# Patient Record
Sex: Female | Born: 1935 | Race: White | Hispanic: No | Smoking: Former smoker
Health system: Southern US, Community
[De-identification: ages and names within clinical notes are randomized; demographics above are authoritative.]

## PROBLEM LIST (undated history)

## (undated) DIAGNOSIS — M1990 Unspecified osteoarthritis, unspecified site: Secondary | ICD-10-CM

## (undated) DIAGNOSIS — I10 Essential (primary) hypertension: Secondary | ICD-10-CM

## (undated) DIAGNOSIS — K859 Acute pancreatitis without necrosis or infection, unspecified: Secondary | ICD-10-CM

## (undated) DIAGNOSIS — I4891 Unspecified atrial fibrillation: Secondary | ICD-10-CM

## (undated) HISTORY — DX: Essential (primary) hypertension: I10

## (undated) HISTORY — PX: CHOLECYSTECTOMY: SHX55

## (undated) HISTORY — DX: Unspecified atrial fibrillation: I48.91

## (undated) HISTORY — PX: TONSILLECTOMY: SUR1361

---

## 2011-06-18 DIAGNOSIS — R109 Unspecified abdominal pain: Secondary | ICD-10-CM | POA: Diagnosis not present

## 2011-06-18 DIAGNOSIS — K811 Chronic cholecystitis: Secondary | ICD-10-CM | POA: Diagnosis not present

## 2011-06-18 DIAGNOSIS — Z7982 Long term (current) use of aspirin: Secondary | ICD-10-CM | POA: Diagnosis not present

## 2011-06-18 DIAGNOSIS — R110 Nausea: Secondary | ICD-10-CM | POA: Diagnosis not present

## 2011-06-18 DIAGNOSIS — K8020 Calculus of gallbladder without cholecystitis without obstruction: Secondary | ICD-10-CM | POA: Diagnosis not present

## 2011-06-18 DIAGNOSIS — M159 Polyosteoarthritis, unspecified: Secondary | ICD-10-CM | POA: Diagnosis not present

## 2011-06-18 DIAGNOSIS — Z01810 Encounter for preprocedural cardiovascular examination: Secondary | ICD-10-CM | POA: Diagnosis not present

## 2011-06-18 DIAGNOSIS — I4891 Unspecified atrial fibrillation: Secondary | ICD-10-CM | POA: Diagnosis not present

## 2011-06-18 DIAGNOSIS — J90 Pleural effusion, not elsewhere classified: Secondary | ICD-10-CM | POA: Diagnosis not present

## 2011-06-18 DIAGNOSIS — K8000 Calculus of gallbladder with acute cholecystitis without obstruction: Secondary | ICD-10-CM | POA: Diagnosis not present

## 2011-06-18 DIAGNOSIS — I10 Essential (primary) hypertension: Secondary | ICD-10-CM | POA: Diagnosis not present

## 2011-06-18 DIAGNOSIS — Z880 Allergy status to penicillin: Secondary | ICD-10-CM | POA: Diagnosis not present

## 2011-06-18 DIAGNOSIS — K859 Acute pancreatitis without necrosis or infection, unspecified: Secondary | ICD-10-CM | POA: Diagnosis not present

## 2011-06-18 DIAGNOSIS — K563 Gallstone ileus: Secondary | ICD-10-CM | POA: Diagnosis not present

## 2011-06-28 DIAGNOSIS — K859 Acute pancreatitis without necrosis or infection, unspecified: Secondary | ICD-10-CM | POA: Diagnosis not present

## 2011-06-28 DIAGNOSIS — M1710 Unilateral primary osteoarthritis, unspecified knee: Secondary | ICD-10-CM | POA: Diagnosis not present

## 2011-06-28 DIAGNOSIS — I10 Essential (primary) hypertension: Secondary | ICD-10-CM | POA: Diagnosis not present

## 2011-07-02 DIAGNOSIS — M1710 Unilateral primary osteoarthritis, unspecified knee: Secondary | ICD-10-CM | POA: Diagnosis not present

## 2011-07-02 DIAGNOSIS — K859 Acute pancreatitis without necrosis or infection, unspecified: Secondary | ICD-10-CM | POA: Diagnosis not present

## 2011-07-02 DIAGNOSIS — I10 Essential (primary) hypertension: Secondary | ICD-10-CM | POA: Diagnosis not present

## 2011-07-06 DIAGNOSIS — I10 Essential (primary) hypertension: Secondary | ICD-10-CM | POA: Diagnosis not present

## 2011-07-06 DIAGNOSIS — M1710 Unilateral primary osteoarthritis, unspecified knee: Secondary | ICD-10-CM | POA: Diagnosis not present

## 2011-07-06 DIAGNOSIS — K859 Acute pancreatitis without necrosis or infection, unspecified: Secondary | ICD-10-CM | POA: Diagnosis not present

## 2011-07-08 DIAGNOSIS — M1710 Unilateral primary osteoarthritis, unspecified knee: Secondary | ICD-10-CM | POA: Diagnosis not present

## 2011-07-08 DIAGNOSIS — I10 Essential (primary) hypertension: Secondary | ICD-10-CM | POA: Diagnosis not present

## 2011-07-08 DIAGNOSIS — K859 Acute pancreatitis without necrosis or infection, unspecified: Secondary | ICD-10-CM | POA: Diagnosis not present

## 2011-07-10 DIAGNOSIS — I10 Essential (primary) hypertension: Secondary | ICD-10-CM | POA: Diagnosis not present

## 2011-07-10 DIAGNOSIS — M159 Polyosteoarthritis, unspecified: Secondary | ICD-10-CM | POA: Diagnosis not present

## 2011-07-13 DIAGNOSIS — M1710 Unilateral primary osteoarthritis, unspecified knee: Secondary | ICD-10-CM | POA: Diagnosis not present

## 2011-07-13 DIAGNOSIS — K859 Acute pancreatitis without necrosis or infection, unspecified: Secondary | ICD-10-CM | POA: Diagnosis not present

## 2011-07-13 DIAGNOSIS — I10 Essential (primary) hypertension: Secondary | ICD-10-CM | POA: Diagnosis not present

## 2011-07-14 DIAGNOSIS — K859 Acute pancreatitis without necrosis or infection, unspecified: Secondary | ICD-10-CM | POA: Diagnosis not present

## 2011-07-14 DIAGNOSIS — I10 Essential (primary) hypertension: Secondary | ICD-10-CM | POA: Diagnosis not present

## 2011-07-14 DIAGNOSIS — M1710 Unilateral primary osteoarthritis, unspecified knee: Secondary | ICD-10-CM | POA: Diagnosis not present

## 2011-07-15 DIAGNOSIS — M1710 Unilateral primary osteoarthritis, unspecified knee: Secondary | ICD-10-CM | POA: Diagnosis not present

## 2011-07-15 DIAGNOSIS — I10 Essential (primary) hypertension: Secondary | ICD-10-CM | POA: Diagnosis not present

## 2011-07-15 DIAGNOSIS — K859 Acute pancreatitis without necrosis or infection, unspecified: Secondary | ICD-10-CM | POA: Diagnosis not present

## 2011-07-16 DIAGNOSIS — M1710 Unilateral primary osteoarthritis, unspecified knee: Secondary | ICD-10-CM | POA: Diagnosis not present

## 2011-07-16 DIAGNOSIS — K859 Acute pancreatitis without necrosis or infection, unspecified: Secondary | ICD-10-CM | POA: Diagnosis not present

## 2011-07-16 DIAGNOSIS — I10 Essential (primary) hypertension: Secondary | ICD-10-CM | POA: Diagnosis not present

## 2011-07-17 DIAGNOSIS — I10 Essential (primary) hypertension: Secondary | ICD-10-CM | POA: Diagnosis not present

## 2011-07-17 DIAGNOSIS — K859 Acute pancreatitis without necrosis or infection, unspecified: Secondary | ICD-10-CM | POA: Diagnosis not present

## 2011-07-17 DIAGNOSIS — M1710 Unilateral primary osteoarthritis, unspecified knee: Secondary | ICD-10-CM | POA: Diagnosis not present

## 2011-07-21 DIAGNOSIS — M1710 Unilateral primary osteoarthritis, unspecified knee: Secondary | ICD-10-CM | POA: Diagnosis not present

## 2011-07-21 DIAGNOSIS — K859 Acute pancreatitis without necrosis or infection, unspecified: Secondary | ICD-10-CM | POA: Diagnosis not present

## 2011-07-21 DIAGNOSIS — I10 Essential (primary) hypertension: Secondary | ICD-10-CM | POA: Diagnosis not present

## 2011-07-23 DIAGNOSIS — I10 Essential (primary) hypertension: Secondary | ICD-10-CM | POA: Diagnosis not present

## 2011-07-23 DIAGNOSIS — K859 Acute pancreatitis without necrosis or infection, unspecified: Secondary | ICD-10-CM | POA: Diagnosis not present

## 2011-07-23 DIAGNOSIS — M1710 Unilateral primary osteoarthritis, unspecified knee: Secondary | ICD-10-CM | POA: Diagnosis not present

## 2011-09-14 DIAGNOSIS — E669 Obesity, unspecified: Secondary | ICD-10-CM | POA: Diagnosis not present

## 2011-09-14 DIAGNOSIS — M159 Polyosteoarthritis, unspecified: Secondary | ICD-10-CM | POA: Diagnosis not present

## 2011-09-14 DIAGNOSIS — I10 Essential (primary) hypertension: Secondary | ICD-10-CM | POA: Diagnosis not present

## 2011-11-04 DIAGNOSIS — H903 Sensorineural hearing loss, bilateral: Secondary | ICD-10-CM | POA: Diagnosis not present

## 2012-01-26 DIAGNOSIS — I10 Essential (primary) hypertension: Secondary | ICD-10-CM | POA: Diagnosis not present

## 2012-01-26 DIAGNOSIS — R7301 Impaired fasting glucose: Secondary | ICD-10-CM | POA: Diagnosis not present

## 2012-06-24 DIAGNOSIS — H0230 Blepharochalasis unspecified eye, unspecified eyelid: Secondary | ICD-10-CM | POA: Diagnosis not present

## 2012-06-24 DIAGNOSIS — H04129 Dry eye syndrome of unspecified lacrimal gland: Secondary | ICD-10-CM | POA: Diagnosis not present

## 2012-06-24 DIAGNOSIS — Z961 Presence of intraocular lens: Secondary | ICD-10-CM | POA: Diagnosis not present

## 2012-09-26 DIAGNOSIS — M159 Polyosteoarthritis, unspecified: Secondary | ICD-10-CM | POA: Diagnosis not present

## 2012-09-26 DIAGNOSIS — Z713 Dietary counseling and surveillance: Secondary | ICD-10-CM | POA: Diagnosis not present

## 2012-09-26 DIAGNOSIS — I10 Essential (primary) hypertension: Secondary | ICD-10-CM | POA: Diagnosis not present

## 2012-09-26 DIAGNOSIS — Z6832 Body mass index (BMI) 32.0-32.9, adult: Secondary | ICD-10-CM | POA: Diagnosis not present

## 2013-03-07 DIAGNOSIS — H903 Sensorineural hearing loss, bilateral: Secondary | ICD-10-CM | POA: Diagnosis not present

## 2013-09-05 DIAGNOSIS — E669 Obesity, unspecified: Secondary | ICD-10-CM | POA: Diagnosis not present

## 2013-09-05 DIAGNOSIS — I10 Essential (primary) hypertension: Secondary | ICD-10-CM | POA: Diagnosis not present

## 2013-09-05 DIAGNOSIS — R7301 Impaired fasting glucose: Secondary | ICD-10-CM | POA: Diagnosis not present

## 2013-09-05 DIAGNOSIS — Z6832 Body mass index (BMI) 32.0-32.9, adult: Secondary | ICD-10-CM | POA: Diagnosis not present

## 2014-02-05 DIAGNOSIS — Z6832 Body mass index (BMI) 32.0-32.9, adult: Secondary | ICD-10-CM | POA: Diagnosis not present

## 2014-02-05 DIAGNOSIS — R1013 Epigastric pain: Secondary | ICD-10-CM | POA: Diagnosis not present

## 2014-02-05 DIAGNOSIS — R1032 Left lower quadrant pain: Secondary | ICD-10-CM | POA: Diagnosis not present

## 2014-02-05 DIAGNOSIS — E6609 Other obesity due to excess calories: Secondary | ICD-10-CM | POA: Diagnosis not present

## 2014-03-20 DIAGNOSIS — H903 Sensorineural hearing loss, bilateral: Secondary | ICD-10-CM | POA: Diagnosis not present

## 2014-06-21 DIAGNOSIS — H04123 Dry eye syndrome of bilateral lacrimal glands: Secondary | ICD-10-CM | POA: Diagnosis not present

## 2014-06-21 DIAGNOSIS — H17823 Peripheral opacity of cornea, bilateral: Secondary | ICD-10-CM | POA: Diagnosis not present

## 2014-06-21 DIAGNOSIS — Z961 Presence of intraocular lens: Secondary | ICD-10-CM | POA: Diagnosis not present

## 2014-06-21 DIAGNOSIS — H02834 Dermatochalasis of left upper eyelid: Secondary | ICD-10-CM | POA: Diagnosis not present

## 2014-06-21 DIAGNOSIS — H02831 Dermatochalasis of right upper eyelid: Secondary | ICD-10-CM | POA: Diagnosis not present

## 2014-10-17 DIAGNOSIS — H6121 Impacted cerumen, right ear: Secondary | ICD-10-CM | POA: Diagnosis not present

## 2014-10-19 DIAGNOSIS — Z6831 Body mass index (BMI) 31.0-31.9, adult: Secondary | ICD-10-CM | POA: Diagnosis not present

## 2014-10-19 DIAGNOSIS — Z1389 Encounter for screening for other disorder: Secondary | ICD-10-CM | POA: Diagnosis not present

## 2014-10-19 DIAGNOSIS — E6609 Other obesity due to excess calories: Secondary | ICD-10-CM | POA: Diagnosis not present

## 2014-10-19 DIAGNOSIS — I10 Essential (primary) hypertension: Secondary | ICD-10-CM | POA: Diagnosis not present

## 2014-10-19 DIAGNOSIS — R7309 Other abnormal glucose: Secondary | ICD-10-CM | POA: Diagnosis not present

## 2015-04-30 DIAGNOSIS — H903 Sensorineural hearing loss, bilateral: Secondary | ICD-10-CM | POA: Diagnosis not present

## 2015-05-14 DIAGNOSIS — E6609 Other obesity due to excess calories: Secondary | ICD-10-CM | POA: Diagnosis not present

## 2015-05-14 DIAGNOSIS — Z6831 Body mass index (BMI) 31.0-31.9, adult: Secondary | ICD-10-CM | POA: Diagnosis not present

## 2015-05-14 DIAGNOSIS — M1991 Primary osteoarthritis, unspecified site: Secondary | ICD-10-CM | POA: Diagnosis not present

## 2015-05-14 DIAGNOSIS — I10 Essential (primary) hypertension: Secondary | ICD-10-CM | POA: Diagnosis not present

## 2015-05-14 DIAGNOSIS — Z1389 Encounter for screening for other disorder: Secondary | ICD-10-CM | POA: Diagnosis not present

## 2015-10-24 DIAGNOSIS — Z6833 Body mass index (BMI) 33.0-33.9, adult: Secondary | ICD-10-CM | POA: Diagnosis not present

## 2015-10-24 DIAGNOSIS — Z0000 Encounter for general adult medical examination without abnormal findings: Secondary | ICD-10-CM | POA: Diagnosis not present

## 2015-10-24 DIAGNOSIS — E6609 Other obesity due to excess calories: Secondary | ICD-10-CM | POA: Diagnosis not present

## 2015-10-24 DIAGNOSIS — I10 Essential (primary) hypertension: Secondary | ICD-10-CM | POA: Diagnosis not present

## 2016-01-13 DIAGNOSIS — Z974 Presence of external hearing-aid: Secondary | ICD-10-CM | POA: Diagnosis not present

## 2016-01-13 DIAGNOSIS — H6121 Impacted cerumen, right ear: Secondary | ICD-10-CM | POA: Diagnosis not present

## 2016-01-13 DIAGNOSIS — H903 Sensorineural hearing loss, bilateral: Secondary | ICD-10-CM | POA: Diagnosis not present

## 2016-04-08 DIAGNOSIS — E782 Mixed hyperlipidemia: Secondary | ICD-10-CM | POA: Diagnosis not present

## 2016-04-08 DIAGNOSIS — Z1389 Encounter for screening for other disorder: Secondary | ICD-10-CM | POA: Diagnosis not present

## 2016-04-08 DIAGNOSIS — I10 Essential (primary) hypertension: Secondary | ICD-10-CM | POA: Diagnosis not present

## 2016-04-08 DIAGNOSIS — Z6833 Body mass index (BMI) 33.0-33.9, adult: Secondary | ICD-10-CM | POA: Diagnosis not present

## 2016-04-08 DIAGNOSIS — E6609 Other obesity due to excess calories: Secondary | ICD-10-CM | POA: Diagnosis not present

## 2016-04-08 DIAGNOSIS — Z23 Encounter for immunization: Secondary | ICD-10-CM | POA: Diagnosis not present

## 2016-06-03 ENCOUNTER — Emergency Department (HOSPITAL_COMMUNITY): Payer: Medicare Other

## 2016-06-03 ENCOUNTER — Observation Stay (HOSPITAL_COMMUNITY)
Admission: EM | Admit: 2016-06-03 | Discharge: 2016-06-04 | Disposition: A | Discharge status: Home / Self Care | Payer: Medicare Other | Attending: Internal Medicine | Admitting: Internal Medicine

## 2016-06-03 ENCOUNTER — Encounter (HOSPITAL_COMMUNITY): Payer: Self-pay | Admitting: *Deleted

## 2016-06-03 DIAGNOSIS — K5900 Constipation, unspecified: Secondary | ICD-10-CM | POA: Diagnosis not present

## 2016-06-03 DIAGNOSIS — E782 Mixed hyperlipidemia: Secondary | ICD-10-CM | POA: Diagnosis not present

## 2016-06-03 DIAGNOSIS — M545 Low back pain, unspecified: Secondary | ICD-10-CM | POA: Diagnosis present

## 2016-06-03 DIAGNOSIS — K5732 Diverticulitis of large intestine without perforation or abscess without bleeding: Secondary | ICD-10-CM | POA: Diagnosis not present

## 2016-06-03 DIAGNOSIS — S22080A Wedge compression fracture of T11-T12 vertebra, initial encounter for closed fracture: Secondary | ICD-10-CM

## 2016-06-03 DIAGNOSIS — Z1389 Encounter for screening for other disorder: Secondary | ICD-10-CM | POA: Diagnosis not present

## 2016-06-03 DIAGNOSIS — R109 Unspecified abdominal pain: Secondary | ICD-10-CM

## 2016-06-03 DIAGNOSIS — Z9049 Acquired absence of other specified parts of digestive tract: Secondary | ICD-10-CM | POA: Insufficient documentation

## 2016-06-03 DIAGNOSIS — Z880 Allergy status to penicillin: Secondary | ICD-10-CM | POA: Insufficient documentation

## 2016-06-03 DIAGNOSIS — M1990 Unspecified osteoarthritis, unspecified site: Secondary | ICD-10-CM | POA: Diagnosis not present

## 2016-06-03 DIAGNOSIS — S32010A Wedge compression fracture of first lumbar vertebra, initial encounter for closed fracture: Secondary | ICD-10-CM

## 2016-06-03 DIAGNOSIS — R319 Hematuria, unspecified: Secondary | ICD-10-CM | POA: Diagnosis not present

## 2016-06-03 DIAGNOSIS — G8929 Other chronic pain: Secondary | ICD-10-CM | POA: Diagnosis not present

## 2016-06-03 DIAGNOSIS — R1030 Lower abdominal pain, unspecified: Secondary | ICD-10-CM | POA: Diagnosis not present

## 2016-06-03 DIAGNOSIS — I10 Essential (primary) hypertension: Secondary | ICD-10-CM | POA: Diagnosis not present

## 2016-06-03 DIAGNOSIS — M4854XA Collapsed vertebra, not elsewhere classified, thoracic region, initial encounter for fracture: Secondary | ICD-10-CM | POA: Diagnosis not present

## 2016-06-03 DIAGNOSIS — M6289 Other specified disorders of muscle: Secondary | ICD-10-CM | POA: Insufficient documentation

## 2016-06-03 DIAGNOSIS — K5792 Diverticulitis of intestine, part unspecified, without perforation or abscess without bleeding: Secondary | ICD-10-CM | POA: Diagnosis present

## 2016-06-03 DIAGNOSIS — R1084 Generalized abdominal pain: Secondary | ICD-10-CM | POA: Diagnosis present

## 2016-06-03 DIAGNOSIS — R260 Ataxic gait: Secondary | ICD-10-CM | POA: Diagnosis not present

## 2016-06-03 DIAGNOSIS — M6281 Muscle weakness (generalized): Secondary | ICD-10-CM | POA: Diagnosis not present

## 2016-06-03 DIAGNOSIS — M4856XA Collapsed vertebra, not elsewhere classified, lumbar region, initial encounter for fracture: Secondary | ICD-10-CM | POA: Insufficient documentation

## 2016-06-03 DIAGNOSIS — Z6832 Body mass index (BMI) 32.0-32.9, adult: Secondary | ICD-10-CM | POA: Diagnosis not present

## 2016-06-03 DIAGNOSIS — M549 Dorsalgia, unspecified: Secondary | ICD-10-CM | POA: Diagnosis not present

## 2016-06-03 DIAGNOSIS — E876 Hypokalemia: Secondary | ICD-10-CM | POA: Diagnosis not present

## 2016-06-03 DIAGNOSIS — R52 Pain, unspecified: Secondary | ICD-10-CM

## 2016-06-03 HISTORY — DX: Acute pancreatitis without necrosis or infection, unspecified: K85.90

## 2016-06-03 HISTORY — DX: Unspecified osteoarthritis, unspecified site: M19.90

## 2016-06-03 LAB — COMPREHENSIVE METABOLIC PANEL
ALBUMIN: 3.8 g/dL (ref 3.5–5.0)
ALT: 10 U/L — ABNORMAL LOW (ref 14–54)
ANION GAP: 11 (ref 5–15)
AST: 40 U/L (ref 15–41)
Alkaline Phosphatase: 73 U/L (ref 38–126)
BUN: 13 mg/dL (ref 6–20)
CHLORIDE: 99 mmol/L — AB (ref 101–111)
CO2: 27 mmol/L (ref 22–32)
Calcium: 9.4 mg/dL (ref 8.9–10.3)
Creatinine, Ser: 0.85 mg/dL (ref 0.44–1.00)
GFR calc non Af Amer: 59 mL/min — ABNORMAL LOW (ref >60)
GLUCOSE: 105 mg/dL — AB (ref 65–99)
POTASSIUM: 2.9 mmol/L — AB (ref 3.5–5.1)
SODIUM: 137 mmol/L (ref 135–145)
Total Bilirubin: 1 mg/dL (ref 0.3–1.2)
Total Protein: 7.2 g/dL (ref 6.5–8.1)

## 2016-06-03 LAB — DIFFERENTIAL
Basophils Absolute: 0 10*3/uL (ref 0.0–0.1)
Basophils Relative: 0 %
EOS ABS: 0.1 10*3/uL (ref 0.0–0.7)
EOS PCT: 1 %
LYMPHS ABS: 1.8 10*3/uL (ref 0.7–4.0)
LYMPHS PCT: 26 %
Monocytes Absolute: 0.8 10*3/uL (ref 0.1–1.0)
Monocytes Relative: 12 %
NEUTROS PCT: 61 %
Neutro Abs: 4.3 10*3/uL (ref 1.7–7.7)

## 2016-06-03 LAB — LIPASE, BLOOD: LIPASE: 15 U/L (ref 11–51)

## 2016-06-03 LAB — CBC
HEMATOCRIT: 46.5 % — AB (ref 36.0–46.0)
HEMOGLOBIN: 15.4 g/dL — AB (ref 12.0–15.0)
MCH: 30.6 pg (ref 26.0–34.0)
MCHC: 33.1 g/dL (ref 30.0–36.0)
MCV: 92.3 fL (ref 78.0–100.0)
Platelets: 315 10*3/uL (ref 150–400)
RBC: 5.04 MIL/uL (ref 3.87–5.11)
RDW: 15.1 % (ref 11.5–15.5)
WBC: 7 10*3/uL (ref 4.0–10.5)

## 2016-06-03 LAB — URINALYSIS, ROUTINE W REFLEX MICROSCOPIC
BILIRUBIN URINE: NEGATIVE
GLUCOSE, UA: NEGATIVE mg/dL
KETONES UR: 5 mg/dL — AB
Leukocytes, UA: NEGATIVE
Nitrite: NEGATIVE
PH: 6 (ref 5.0–8.0)
PROTEIN: NEGATIVE mg/dL
Specific Gravity, Urine: 1.017 (ref 1.005–1.030)

## 2016-06-03 LAB — LACTIC ACID, PLASMA
LACTIC ACID, VENOUS: 1.1 mmol/L (ref 0.5–1.9)
Lactic Acid, Venous: 1.2 mmol/L (ref 0.5–1.9)

## 2016-06-03 MED ORDER — MORPHINE SULFATE (PF) 2 MG/ML IV SOLN
2.0000 mg | INTRAVENOUS | Status: DC | PRN
  Administered 2016-06-03: 2 mg via INTRAVENOUS
  Filled 2016-06-03: qty 1

## 2016-06-03 MED ORDER — POTASSIUM CHLORIDE 10 MEQ/100ML IV SOLN
10.0000 meq | Freq: Once | INTRAVENOUS | Status: AC
Start: 2016-06-03 — End: 2016-06-03
  Administered 2016-06-03: 10 meq via INTRAVENOUS
  Filled 2016-06-03: qty 100

## 2016-06-03 MED ORDER — ACETAMINOPHEN 325 MG PO TABS: 650.0000 mg | ORAL_TABLET | Freq: Four times a day (QID) | ORAL | Status: DC | PRN

## 2016-06-03 MED ORDER — DILTIAZEM HCL ER COATED BEADS 240 MG PO CP24
240.0000 mg | ORAL_CAPSULE | Freq: Every day | ORAL | Status: DC
  Administered 2016-06-04: 240 mg via ORAL
  Filled 2016-06-03: qty 1

## 2016-06-03 MED ORDER — HEPARIN SODIUM (PORCINE) 5000 UNIT/ML IJ SOLN
5000.0000 [IU] | Freq: Three times a day (TID) | INTRAMUSCULAR | Status: DC
  Administered 2016-06-03 – 2016-06-04 (×2): 5000 [IU] via SUBCUTANEOUS
  Filled 2016-06-03 (×2): qty 1

## 2016-06-03 MED ORDER — ACETAMINOPHEN 650 MG RE SUPP: 650.0000 mg | Freq: Four times a day (QID) | RECTAL | Status: DC | PRN

## 2016-06-03 MED ORDER — CIPROFLOXACIN IN D5W 400 MG/200ML IV SOLN
400.0000 mg | Freq: Two times a day (BID) | INTRAVENOUS | Status: DC
  Administered 2016-06-04: 400 mg via INTRAVENOUS
  Filled 2016-06-03: qty 200

## 2016-06-03 MED ORDER — SODIUM CHLORIDE 0.9% FLUSH
3.0000 mL | Freq: Two times a day (BID) | INTRAVENOUS | Status: DC
  Administered 2016-06-03 – 2016-06-04 (×2): 3 mL via INTRAVENOUS

## 2016-06-03 MED ORDER — METRONIDAZOLE IN NACL 5-0.79 MG/ML-% IV SOLN
500.0000 mg | Freq: Once | INTRAVENOUS | Status: AC
  Administered 2016-06-03: 500 mg via INTRAVENOUS
  Filled 2016-06-03: qty 100

## 2016-06-03 MED ORDER — ONDANSETRON HCL 4 MG/2ML IJ SOLN
4.0000 mg | INTRAMUSCULAR | Status: DC | PRN
  Administered 2016-06-03: 4 mg via INTRAVENOUS
  Filled 2016-06-03: qty 2

## 2016-06-03 MED ORDER — METRONIDAZOLE IN NACL 5-0.79 MG/ML-% IV SOLN
500.0000 mg | Freq: Three times a day (TID) | INTRAVENOUS | Status: DC
  Administered 2016-06-04 (×2): 500 mg via INTRAVENOUS
  Filled 2016-06-03 (×2): qty 100

## 2016-06-03 MED ORDER — KETOROLAC TROMETHAMINE 30 MG/ML IJ SOLN
30.0000 mg | Freq: Four times a day (QID) | INTRAMUSCULAR | Status: DC | PRN
  Administered 2016-06-04: 30 mg via INTRAVENOUS
  Filled 2016-06-03: qty 1

## 2016-06-03 MED ORDER — CIPROFLOXACIN IN D5W 400 MG/200ML IV SOLN
400.0000 mg | Freq: Once | INTRAVENOUS | Status: AC
  Administered 2016-06-03: 400 mg via INTRAVENOUS
  Filled 2016-06-03: qty 200

## 2016-06-03 MED ORDER — POTASSIUM CHLORIDE CRYS ER 20 MEQ PO TBCR
20.0000 meq | EXTENDED_RELEASE_TABLET | Freq: Every day | ORAL | Status: DC
  Administered 2016-06-03 – 2016-06-04 (×2): 20 meq via ORAL
  Filled 2016-06-03 (×2): qty 1

## 2016-06-03 MED ORDER — IOPAMIDOL (ISOVUE-300) INJECTION 61%
100.0000 mL | Freq: Once | INTRAVENOUS | Status: AC | PRN
  Administered 2016-06-03: 100 mL via INTRAVENOUS

## 2016-06-03 NOTE — H&P (Signed)
History and Physical    BRINDA FOCHT ZYS:[id number] DOB: [birth year] DOA: 06/03/2016  PCP: Purvis Kilts, MD  Patient coming from: Home.    Chief Complaint:   Back pain and abdominal pain.   HPI: Teresa Stevens is an 83 y.o. female with hx of arthritis, chronic lower back pain, but worsen the past 2 weeks, HTN, pancreatitis, presented to the ER for back and abdominal pain.  She has no fever, chills, black for bloody stool.  Pain in non radiating.  She has no lower ext weakness, incontinence or retention.  Work up in the ER included a abdominal pelvic CT which showed questionable diverticulitis, but otherwise unremarkable.  She was started on IV Cipro and IV Flagyl, and hospitalist was asked to admit her for diverticulitis.  She has no fever or leukcocytosis.   ED Course:  See above.  Rewiew of Systems:  Constitutional: Negative for malaise, fever and chills. No significant weight loss or weight gain Eyes: Negative for eye pain, redness and discharge, diplopia, visual changes, or flashes of light. ENMT: Negative for ear pain, hoarseness, nasal congestion, sinus pressure and sore throat. No headaches; tinnitus, drooling, or problem swallowing. Cardiovascular: Negative for chest pain, palpitations, diaphoresis, dyspnea and peripheral edema. ; No orthopnea, PND Respiratory: Negative for cough, hemoptysis, wheezing and stridor. No pleuritic chestpain. Gastrointestinal: Negative for diarrhea, constipation,  melena, blood in stool, hematemesis, jaundice and rectal bleeding.    Genitourinary: Negative for frequency, dysuria, incontinence,flank pain and hematuria; Musculoskeletal: Negative for back pain and neck pain. Negative for swelling and trauma.;  Skin: . Negative for pruritus, rash, abrasions, bruising and skin lesion.; ulcerations Neuro: Negative for headache, lightheadedness and neck stiffness. Negative for weakness, altered level of consciousness , altered mental status, extremity  weakness, burning feet, involuntary movement, seizure and syncope.  Psych: negative for anxiety, depression, insomnia, tearfulness, panic attacks, hallucinations, paranoia, suicidal or homicidal ideation   Past Medical History:  Diagnosis Date  . Arthritis   . Hypertension   . Pancreatitis     Past Surgical History:  Procedure Laterality Date  . CHOLECYSTECTOMY       reports that she has never smoked. She has never used smokeless tobacco. She reports that she does not drink alcohol or use drugs.  Allergies  Allergen Reactions  . Penicillins Rash    History reviewed. No pertinent family history.   Prior to Admission medications   Medication Sig Start Date End Date Taking? Authorizing Provider  diltiazem (CARTIA XT) 240 MG 24 hr capsule Take 1 capsule by mouth daily. 12/19/15  Yes Historical Provider, MD    Physical Exam: Vitals:   06/03/16 1415 06/03/16 1425 06/03/16 1430 06/03/16 1637  BP:  (!) 159/92 (!) 149/75 (!) 160/73  Pulse: 86 92 87 98  Resp:  20  20  Temp:      TempSrc:      SpO2: 94% 97% 93% 94%  Weight:      Height:          Constitutional: NAD, calm, comfortable Vitals:   06/03/16 1415 06/03/16 1425 06/03/16 1430 06/03/16 1637  BP:  (!) 159/92 (!) 149/75 (!) 160/73  Pulse: 86 92 87 98  Resp:  20  20  Temp:      TempSrc:      SpO2: 94% 97% 93% 94%  Weight:      Height:       Eyes: PERRL, lids and conjunctivae normal ENMT: Mucous membranes are moist. Posterior pharynx clear of  any exudate or lesions.Normal dentition.  Neck: normal, supple, no masses, no thyromegaly Respiratory: clear to auscultation bilaterally, no wheezing, no crackles. Normal respiratory effort. No accessory muscle use.  Cardiovascular: Regular rate and rhythm, no murmurs / rubs / gallops. No extremity edema. 2+ pedal pulses. No carotid bruits.  Abdomen: no tenderness, no masses palpated. No hepatosplenomegaly. Bowel sounds positive.  Musculoskeletal: no clubbing / cyanosis.  No joint deformity upper and lower extremities. Good ROM, no contractures. Normal muscle tone.  Skin: no rashes, lesions, ulcers. No induration Neurologic: CN 2-12 grossly intact. Sensation intact, DTR normal. Strength 5/5 in all 4.  Psychiatric: Normal judgment and insight. Alert and oriented x 3. Normal mood.     Labs on Admission: I have personally reviewed following labs and imaging studies  CBC:  Recent Labs Lab 06/03/16 1302  WBC 7.0  NEUTROABS 4.3  HGB 15.4*  HCT 46.5*  MCV 92.3  PLT 629   Basic Metabolic Panel:  Recent Labs Lab 06/03/16 1302  NA 137  K 2.9*  CL 99*  CO2 27  GLUCOSE 105*  BUN 13  CREATININE 0.85  CALCIUM 9.4   GFR: Estimated Creatinine Clearance: 46.7 mL/min (by C-G formula based on SCr of 0.85 mg/dL). Liver Function Tests:  Recent Labs Lab 06/03/16 1302  AST 40  ALT 10*  ALKPHOS 73  BILITOT 1.0  PROT 7.2  ALBUMIN 3.8    Recent Labs Lab 06/03/16 1302  LIPASE 15   Urine analysis:    Component Value Date/Time   COLORURINE YELLOW 06/03/2016 [place] 06/03/2016 1255   LABSPEC 1.017 06/03/2016 1255   PHURINE 6.0 06/03/2016 1255   GLUCOSEU NEGATIVE 06/03/2016 1255   HGBUR SMALL (A) 06/03/2016 1255   BILIRUBINUR NEGATIVE 06/03/2016 1255   KETONESUR 5 (A) 06/03/2016 1255   PROTEINUR NEGATIVE 06/03/2016 1255   NITRITE NEGATIVE 06/03/2016 1255   LEUKOCYTESUR NEGATIVE 06/03/2016 1255    Radiological Exams on Admission: Dg Chest 1 View  Result Date: 06/03/2016 CLINICAL DATA:  Abdominal pain, nausea EXAM: CHEST 1 VIEW COMPARISON:  None. FINDINGS: There is bilateral diffuse mild interstitial thickening likely reflecting chronic interstitial disease. There is no focal parenchymal opacity. There is no pleural effusion or pneumothorax. There is stable cardiomegaly. The osseous structures are unremarkable. IMPRESSION: No active disease. Chronic interstitial lung disease. Electronically Signed   By: Kathreen Devoid   On:  06/03/2016 15:20   Ct Abdomen Pelvis W Contrast  Result Date: 06/03/2016 CLINICAL DATA:  Lower abdominal pain.  Reported hematuria. EXAM: CT ABDOMEN AND PELVIS WITH CONTRAST TECHNIQUE: Multidetector CT imaging of the abdomen and pelvis was performed using the standard protocol following bolus administration of intravenous contrast. CONTRAST:  141mL ISOVUE-300 IOPAMIDOL (ISOVUE-300) INJECTION 61% COMPARISON:  None. FINDINGS: Lower chest: There are bibasilar multifocal reticular opacities and septal thickening. No pleural effusion or pulmonary nodules. Hepatobiliary: Normal hepatic size and contours without focal liver lesion. No perihepatic ascites. No intra- or extrahepatic biliary dilatation. Status post cholecystectomy. Pancreas: Normal pancreatic contours and enhancement. No peripancreatic fluid collection or pancreatic ductal dilatation. Spleen: Contrast enhancing focus within the posterior aspect of the spleen, measuring 1.5 cm Adrenals/Urinary Tract: Normal adrenal glands. No hydronephrosis or solid renal mass. Stomach/Bowel: There is rectosigmoid diverticulosis with minimal adjacent fat stranding but no free fluid. No dilated small bowel or evidence of small bowel inflammation. Vascular/Lymphatic: There is atherosclerotic calcification of the non aneurysmal abdominal aorta. No abdominal or pelvic adenopathy. Reproductive: Unremarkable uterus.  No adnexal mass. Musculoskeletal: Large  Schmorl's node at the inferior endplate of L3 associated underlying sclerosis. Age-indeterminate compression fractures of T12 and L1. Normal visualized extrathoracic and extraperitoneal soft tissues. Other: Asymmetric and soft tissue within the left breast measuring 2.3 x 1.6 cm. IMPRESSION: 1. Rectosigmoid diverticulosis with minimal surrounding in fat infiltration. This appearance is nonspecific but, in the appropriate context, could indicate early diverticulitis. 2. Age indeterminate compression fractures of T12 and L1.  Given the lack of surrounding soft tissue abnormality, these are not suspected to be acute. If there is concern for an acute compression fracture, MRI might be helpful. 3. Asymmetric soft tissue within the left breast. Recommend correlation with prior mammograms, if available. 4. Enhancing lesion within the spleen, likely a hemangioma. Electronically Signed   By: Ulyses Jarred M.D.   On: 06/03/2016 15:42    EKG: Independently reviewed.   Assessment/Plan Principal Problem:   Diverticulitis large intestine Active Problems:   Chronic low back pain   Essential hypertension   Diverticulitis    PLAN:   Possible diverticulitis:  I am not totally convinced, but she did have some LLQ pain.  CT queried diverticulitis.  No fever or leukocytosis.  Will continue with IV Cipro and Flagyl.   Chronic low back pain:  No alarming red flags.  Have not ordered MRI of the back, but low threadhold to do so.  Obtain PT for the morning.  HTN:  Stable.   Hypokalemia:  Will supplement.    DVT prophylaxis: sub Q heparin.  Code Status: FULL CODE.  Family Communication: son and daughter in laws at bedside.  Disposition Plan: Home.  Consults called: None.  Admission status: inpatient.    Ife Vitelli MD FACP. [facility]  If 7PM-7AM, please contact night-coverage [email or web address] Password [facility]  06/03/2016, 5:32 PM

## 2016-06-03 NOTE — ED Provider Notes (Signed)
[facility] DEPT Provider Note   CSN: [id number] Arrival date & time: 06/03/16  1238     History   Chief Complaint Chief Complaint  Patient presents with  . Abdominal Pain    HPI Teresa Stevens is a 83 y.o. female.  HPI  Pt was seen at 1335.  Per pt, her caregiver and family, c/o gradual onset and persistence of constant generalized abd "pain" for the past 1 month. Has been associated with bilateral low back pain, nausea and multiple intermittent episodes of "dry heaving."  Describes the abd pain as "aching" and "sore."  Denies diarrhea, no fevers, no rash, no CP/SOB, no black or blood in stools, no falls, no injury.      Past Medical History:  Diagnosis Date  . Arthritis   . Hypertension   . Pancreatitis     There are no active problems to display for this patient.   Past Surgical History:  Procedure Laterality Date  . CHOLECYSTECTOMY      OB History    No data available       Home Medications    Prior to Admission medications   Not on File    Family History No family history on file.  Social History Social History  Substance Use Topics  . Smoking status: Never Smoker  . Smokeless tobacco: Never Used  . Alcohol use No     Allergies   Penicillins   Review of Systems Review of Systems ROS: Statement: All systems negative except as marked or noted in the HPI; Constitutional: Negative for fever and chills. ; ; Eyes: Negative for eye pain, redness and discharge. ; ; ENMT: Negative for ear pain, hoarseness, nasal congestion, sinus pressure and sore throat. ; ; Cardiovascular: Negative for chest pain, palpitations, diaphoresis, dyspnea and peripheral edema. ; ; Respiratory: Negative for cough, wheezing and stridor. ; ; Gastrointestinal: +nausea, dry heaves, abd pain. Negative for diarrhea, blood in stool, hematemesis, jaundice and rectal bleeding. . ; ; Genitourinary: Negative for dysuria, flank pain and hematuria. ; ; Musculoskeletal: +LBP. Negative for  neck pain. Negative for swelling and trauma.; ; Skin: Negative for pruritus, rash, abrasions, blisters, bruising and skin lesion.; ; Neuro: Negative for headache, lightheadedness and neck stiffness. Negative for weakness, altered level of consciousness, altered mental status, extremity weakness, paresthesias, involuntary movement, seizure and syncope.      Physical Exam Updated Vital Signs BP (!) 156/81 (BP Location: Right Arm)   Pulse 97   Temp 97.7 F (36.5 C) (Oral)   Resp 18   Ht 5\' 5"  (1.651 m)   Wt 182 lb (82.6 kg)   SpO2 95%   BMI 30.29 kg/m   Physical Exam 1340: Physical examination:  Nursing notes reviewed; Vital signs and O2 SAT reviewed;  Constitutional: Well developed, Well nourished, Well hydrated, In no acute distress; Head:  Normocephalic, atraumatic; Eyes: EOMI, PERRL, No scleral icterus; ENMT: Mouth and pharynx normal, Mucous membranes moist; Neck: Supple, Full range of motion, No lymphadenopathy; Cardiovascular: Regular rate and rhythm, No gallop; Respiratory: Breath sounds clear & equal bilaterally, No wheezes.  Speaking full sentences with ease, Normal respiratory effort/excursion; Chest: Nontender, Movement normal; Abdomen: Soft, +diffuse tenderness, esp LLQ and RLQ. Nondistended, Normal bowel sounds; Genitourinary: +bilat CVA tenderness; Spine:  No midline CS, TS, LS tenderness. +TTP bilat lumbar paraspinal muscles.;; Extremities: Pulses normal, No tenderness, No edema, No calf edema or asymmetry.; Neuro: AA&Ox3, +HOH, otherwise major CN grossly intact.  Speech clear. No gross focal motor or sensory  deficits in extremities.; Skin: Color normal, Warm, Dry.   ED Treatments / Results  Labs (all labs ordered are listed, but only abnormal results are displayed)   EKG  EKG Interpretation None       Radiology   Procedures Procedures (including critical care time)  Medications Ordered in ED Medications  ondansetron (ZOFRAN) injection 4 mg (not administered)    morphine 2 MG/ML injection 2 mg (not administered)     Initial Impression / Assessment and Plan / ED Course  I have reviewed the triage vital signs and the nursing notes.  Pertinent labs & imaging results that were available during my care of the patient were reviewed by me and considered in my medical decision making (see chart for details).  MDM Reviewed: nursing note and vitals Interpretation: labs, ECG, x-ray and CT scan    Results for orders placed or performed during the hospital encounter of 06/03/16  Lipase, blood  Result Value Ref Range   Lipase 15 11 - 51 U/L  Comprehensive metabolic panel  Result Value Ref Range   Sodium 137 135 - 145 mmol/L   Potassium 2.9 (L) 3.5 - 5.1 mmol/L   Chloride 99 (L) 101 - 111 mmol/L   CO2 27 22 - 32 mmol/L   Glucose, Bld 105 (H) 65 - 99 mg/dL   BUN 13 6 - 20 mg/dL   Creatinine, Ser 0.85 0.44 - 1.00 mg/dL   Calcium 9.4 8.9 - 10.3 mg/dL   Total Protein 7.2 6.5 - 8.1 g/dL   Albumin 3.8 3.5 - 5.0 g/dL   AST 40 15 - 41 U/L   ALT 10 (L) 14 - 54 U/L   Alkaline Phosphatase 73 38 - 126 U/L   Total Bilirubin 1.0 0.3 - 1.2 mg/dL   GFR calc non Af Amer 59 (L) >60 mL/min   GFR calc Af Amer >60 >60 mL/min   Anion gap 11 5 - 15  CBC  Result Value Ref Range   WBC 7.0 4.0 - 10.5 K/uL   RBC 5.04 3.87 - 5.11 MIL/uL   Hemoglobin 15.4 (H) 12.0 - 15.0 g/dL   HCT 46.5 (H) 36.0 - 46.0 %   MCV 92.3 78.0 - 100.0 fL   MCH 30.6 26.0 - 34.0 pg   MCHC 33.1 30.0 - 36.0 g/dL   RDW 15.1 11.5 - 15.5 %   Platelets 315 150 - 400 K/uL  Urinalysis, Routine w reflex microscopic  Result Value Ref Range   Color, Urine YELLOW YELLOW   APPearance CLEAR CLEAR   Specific Gravity, Urine 1.017 1.005 - 1.030   pH 6.0 5.0 - 8.0   Glucose, UA NEGATIVE NEGATIVE mg/dL   Hgb urine dipstick SMALL (A) NEGATIVE   Bilirubin Urine NEGATIVE NEGATIVE   Ketones, ur 5 (A) NEGATIVE mg/dL   Protein, ur NEGATIVE NEGATIVE mg/dL   Nitrite NEGATIVE NEGATIVE   Leukocytes, UA  NEGATIVE NEGATIVE   RBC / HPF 0-5 0 - 5 RBC/hpf   WBC, UA 0-5 0 - 5 WBC/hpf   Bacteria, UA RARE (A) NONE SEEN   Squamous Epithelial / LPF 0-5 (A) NONE SEEN   Mucous PRESENT    Hyaline Casts, UA PRESENT   Lactic acid, plasma  Result Value Ref Range   Lactic Acid, Venous 1.2 0.5 - 1.9 mmol/L  Differential  Result Value Ref Range   Neutrophils Relative % 61 %   Neutro Abs 4.3 1.7 - 7.7 K/uL   Lymphocytes Relative 26 %   Lymphs Abs  1.8 0.7 - 4.0 K/uL   Monocytes Relative 12 %   Monocytes Absolute 0.8 0.1 - 1.0 K/uL   Eosinophils Relative 1 %   Eosinophils Absolute 0.1 0.0 - 0.7 K/uL   Basophils Relative 0 %   Basophils Absolute 0.0 0.0 - 0.1 K/uL   Dg Chest 1 View Result Date: 06/03/2016 CLINICAL DATA:  Abdominal pain, nausea EXAM: CHEST 1 VIEW COMPARISON:  None. FINDINGS: There is bilateral diffuse mild interstitial thickening likely reflecting chronic interstitial disease. There is no focal parenchymal opacity. There is no pleural effusion or pneumothorax. There is stable cardiomegaly. The osseous structures are unremarkable. IMPRESSION: No active disease. Chronic interstitial lung disease. Electronically Signed   By: Kathreen Devoid   On: 06/03/2016 15:20   Ct Abdomen Pelvis W Contrast Result Date: 06/03/2016 CLINICAL DATA:  Lower abdominal pain.  Reported hematuria. EXAM: CT ABDOMEN AND PELVIS WITH CONTRAST TECHNIQUE: Multidetector CT imaging of the abdomen and pelvis was performed using the standard protocol following bolus administration of intravenous contrast. CONTRAST:  135mL ISOVUE-300 IOPAMIDOL (ISOVUE-300) INJECTION 61% COMPARISON:  None. FINDINGS: Lower chest: There are bibasilar multifocal reticular opacities and septal thickening. No pleural effusion or pulmonary nodules. Hepatobiliary: Normal hepatic size and contours without focal liver lesion. No perihepatic ascites. No intra- or extrahepatic biliary dilatation. Status post cholecystectomy. Pancreas: Normal pancreatic contours and  enhancement. No peripancreatic fluid collection or pancreatic ductal dilatation. Spleen: Contrast enhancing focus within the posterior aspect of the spleen, measuring 1.5 cm Adrenals/Urinary Tract: Normal adrenal glands. No hydronephrosis or solid renal mass. Stomach/Bowel: There is rectosigmoid diverticulosis with minimal adjacent fat stranding but no free fluid. No dilated small bowel or evidence of small bowel inflammation. Vascular/Lymphatic: There is atherosclerotic calcification of the non aneurysmal abdominal aorta. No abdominal or pelvic adenopathy. Reproductive: Unremarkable uterus.  No adnexal mass. Musculoskeletal: Large Schmorl's node at the inferior endplate of L3 associated underlying sclerosis. Age-indeterminate compression fractures of T12 and L1. Normal visualized extrathoracic and extraperitoneal soft tissues. Other: Asymmetric and soft tissue within the left breast measuring 2.3 x 1.6 cm. IMPRESSION: 1. Rectosigmoid diverticulosis with minimal surrounding in fat infiltration. This appearance is nonspecific but, in the appropriate context, could indicate early diverticulitis. 2. Age indeterminate compression fractures of T12 and L1. Given the lack of surrounding soft tissue abnormality, these are not suspected to be acute. If there is concern for an acute compression fracture, MRI might be helpful. 3. Asymmetric soft tissue within the left breast. Recommend correlation with prior mammograms, if available. 4. Enhancing lesion within the spleen, likely a hemangioma. Electronically Signed   By: Ulyses Jarred M.D.   On: 06/03/2016 15:42     1655:  Given pt's significant tenderness on abd exam, will dose IV abx; admit.  Potassium repleted IV. Dx and testing d/w pt and family.  Questions answered.  Verb understanding, agreeable to admit. T/C to [facility]. [place] Comment, case discussed, including:  HPI, pertinent PM/SHx, VS/PE, dx testing, ED course and treatment:  Agreeable to admit, requests he will come to the  ED for evaluation.    Final Clinical Impressions(s) / ED Diagnoses   Final diagnoses:  None    New Prescriptions New Prescriptions   No medications on file     Francine Graven, DO 06/04/16 1508

## 2016-06-03 NOTE — ED Triage Notes (Signed)
Pt was sent over for pain in her lower abdomen starting several weeks ago. Pt states she is having pain in her lower back as well. Pts aide states she has had blood in her urine.

## 2016-06-04 DIAGNOSIS — K5732 Diverticulitis of large intestine without perforation or abscess without bleeding: Secondary | ICD-10-CM

## 2016-06-04 DIAGNOSIS — R109 Unspecified abdominal pain: Secondary | ICD-10-CM | POA: Diagnosis not present

## 2016-06-04 DIAGNOSIS — M4856XA Collapsed vertebra, not elsewhere classified, lumbar region, initial encounter for fracture: Secondary | ICD-10-CM

## 2016-06-04 DIAGNOSIS — S22080A Wedge compression fracture of T11-T12 vertebra, initial encounter for closed fracture: Secondary | ICD-10-CM | POA: Diagnosis not present

## 2016-06-04 DIAGNOSIS — I10 Essential (primary) hypertension: Secondary | ICD-10-CM | POA: Diagnosis not present

## 2016-06-04 DIAGNOSIS — G8929 Other chronic pain: Secondary | ICD-10-CM | POA: Diagnosis not present

## 2016-06-04 DIAGNOSIS — M545 Low back pain: Secondary | ICD-10-CM

## 2016-06-04 LAB — BASIC METABOLIC PANEL
ANION GAP: 9 (ref 5–15)
BUN: 10 mg/dL (ref 6–20)
CHLORIDE: 101 mmol/L (ref 101–111)
CO2: 26 mmol/L (ref 22–32)
Calcium: 8.8 mg/dL — ABNORMAL LOW (ref 8.9–10.3)
Creatinine, Ser: 0.7 mg/dL (ref 0.44–1.00)
GFR calc non Af Amer: >60 mL/min (ref >60)
Glucose, Bld: 94 mg/dL (ref 65–99)
POTASSIUM: 3.7 mmol/L (ref 3.5–5.1)
Sodium: 136 mmol/L (ref 135–145)

## 2016-06-04 MED ORDER — METRONIDAZOLE 500 MG PO TABS: 500.0000 mg | ORAL_TABLET | Freq: Three times a day (TID) | ORAL | 0 refills | Status: DC

## 2016-06-04 MED ORDER — CIPROFLOXACIN HCL 500 MG PO TABS: 500.0000 mg | ORAL_TABLET | Freq: Two times a day (BID) | ORAL | 0 refills | Status: DC

## 2016-06-04 MED ORDER — TRAMADOL HCL 50 MG PO TABS: 50.0000 mg | ORAL_TABLET | Freq: Four times a day (QID) | ORAL | 0 refills | Status: DC | PRN

## 2016-06-04 NOTE — Care Management Note (Signed)
Case Management Note  Patient Details  Name: MAGHAN JESSEE MRN: [id number] Date of Birth: September 20, 1924  Subjective/Objective:                  Pt admitted with diverticulitis. She is from home, where she has 24/7 aid services. She has PCP, transportation and no difficulty affording medications. She has no transportation needs. She has insurance with drug coverage. She plans to return home with resumption of previous arrangements. She has rollator at home she uses with ambulation. No HH services pta. PT recommends no PT f/u. No needs communicated. Family at bedside to discuss DC planning.   Action/Plan: Pt discharging home today.   Expected Discharge Date:  06/04/16               Expected Discharge Plan:  Home/Self Care  In-House Referral:  NA  Discharge planning Services  CM Consult  Post Acute Care Choice:  NA Choice offered to:  NA  Status of Service:  Completed, signed off  Sherald Barge, RN 06/04/2016, 1:59 PM

## 2016-06-04 NOTE — Care Management Obs Status (Signed)
[place] NOTIFICATION   Patient Details  Name: Teresa Stevens MRN: [id number] Date of Birth: 12/23/1924   Medicare Observation Status Notification Given:  Yes    Sherald Barge, RN 06/04/2016, 1:59 PM

## 2016-06-04 NOTE — Discharge Summary (Signed)
Physician Discharge Summary  Teresa Stevens ZOX:[id number] DOB: 1924/07/29 DOA: 06/03/2016  PCP: Purvis Kilts, MD  Admit date: 06/03/2016 Discharge date: 06/04/2016  Admitted From: home Disposition:  home  Recommendations for Outpatient Follow-up:  1. Follow up with PCP in 1-2 weeks 2. Please obtain BMP/CBC in one week   Home Health: Equipment/Devices:  Discharge Condition: stable CODE STATUS: full code Diet recommendation: Heart Healthy   Brief/Interim Summary: This is a 83 year old female with history of chronic lower back pain and arthritis who presents to the hospital with worsening back pain for approximately 2 weeks. She also had some abdominal pain. Workup in the emergency room included CT of the abdomen and pelvis which showed questionable diverticulitis. She was admitted to the hospital and started on intravenous antibiotics. Diet was slowly advanced to solid food. Her abdominal pain has since markedly improved and she is tolerating a solid diet. She will be transitioned to oral antibiotics to complete her course. Regarding her back pain, there appears to be indeterminate compression fractures at T12 and L1 noted on CT scan. The patient had good strength in her legs bilaterally. Her pain appears to be reasonably controlled. She does not have any bowel or bladder incontinence. I did not feel there would be any further yield by performing MRI. Recommendations were for physical therapy and pain management. She was seen by physical therapy who did not feel that she needed further outpatient therapy. She'll be discharged on pain medications. The patient is otherwise stable for discharge home today.  Discharge Diagnoses:  Principal Problem:   Diverticulitis large intestine Active Problems:   Chronic low back pain   Essential hypertension   Diverticulitis    Discharge Instructions  Discharge Instructions    Diet - low sodium heart healthy    Complete by:  As directed     Increase activity slowly    Complete by:  As directed      Allergies as of 06/04/2016      Reactions   Penicillins Rash      Medication List    TAKE these medications   CARTIA XT 240 MG 24 hr capsule Generic drug:  diltiazem Take 1 capsule by mouth daily.   ciprofloxacin 500 MG tablet Commonly known as:  CIPRO Take 1 tablet (500 mg total) by mouth 2 (two) times daily.   metroNIDAZOLE 500 MG tablet Commonly known as:  FLAGYL Take 1 tablet (500 mg total) by mouth 3 (three) times daily.   traMADol 50 MG tablet Commonly known as:  ULTRAM Take 1 tablet (50 mg total) by mouth every 6 (six) hours as needed.       Allergies  Allergen Reactions  . Penicillins Rash    Consultations:     Procedures/Studies: Dg Chest 1 View  Result Date: 06/03/2016 CLINICAL DATA:  Abdominal pain, nausea EXAM: CHEST 1 VIEW COMPARISON:  None. FINDINGS: There is bilateral diffuse mild interstitial thickening likely reflecting chronic interstitial disease. There is no focal parenchymal opacity. There is no pleural effusion or pneumothorax. There is stable cardiomegaly. The osseous structures are unremarkable. IMPRESSION: No active disease. Chronic interstitial lung disease. Electronically Signed   By: Kathreen Devoid   On: 06/03/2016 15:20   Ct Abdomen Pelvis W Contrast  Result Date: 06/03/2016 CLINICAL DATA:  Lower abdominal pain.  Reported hematuria. EXAM: CT ABDOMEN AND PELVIS WITH CONTRAST TECHNIQUE: Multidetector CT imaging of the abdomen and pelvis was performed using the standard protocol following bolus administration of intravenous contrast. CONTRAST:  123mL ISOVUE-300 IOPAMIDOL (ISOVUE-300) INJECTION 61% COMPARISON:  None. FINDINGS: Lower chest: There are bibasilar multifocal reticular opacities and septal thickening. No pleural effusion or pulmonary nodules. Hepatobiliary: Normal hepatic size and contours without focal liver lesion. No perihepatic ascites. No intra- or extrahepatic biliary  dilatation. Status post cholecystectomy. Pancreas: Normal pancreatic contours and enhancement. No peripancreatic fluid collection or pancreatic ductal dilatation. Spleen: Contrast enhancing focus within the posterior aspect of the spleen, measuring 1.5 cm Adrenals/Urinary Tract: Normal adrenal glands. No hydronephrosis or solid renal mass. Stomach/Bowel: There is rectosigmoid diverticulosis with minimal adjacent fat stranding but no free fluid. No dilated small bowel or evidence of small bowel inflammation. Vascular/Lymphatic: There is atherosclerotic calcification of the non aneurysmal abdominal aorta. No abdominal or pelvic adenopathy. Reproductive: Unremarkable uterus.  No adnexal mass. Musculoskeletal: Large Schmorl's node at the inferior endplate of L3 associated underlying sclerosis. Age-indeterminate compression fractures of T12 and L1. Normal visualized extrathoracic and extraperitoneal soft tissues. Other: Asymmetric and soft tissue within the left breast measuring 2.3 x 1.6 cm. IMPRESSION: 1. Rectosigmoid diverticulosis with minimal surrounding in fat infiltration. This appearance is nonspecific but, in the appropriate context, could indicate early diverticulitis. 2. Age indeterminate compression fractures of T12 and L1. Given the lack of surrounding soft tissue abnormality, these are not suspected to be acute. If there is concern for an acute compression fracture, MRI might be helpful. 3. Asymmetric soft tissue within the left breast. Recommend correlation with prior mammograms, if available. 4. Enhancing lesion within the spleen, likely a hemangioma. Electronically Signed   By: Ulyses Jarred M.D.   On: 06/03/2016 15:42       Subjective: Abdominal pain is better. Tolerating diet. Still has some back pain  Discharge Exam: Vitals:   06/03/16 2142 06/04/16 0620  BP: (!) 157/80 140/65  Pulse: 92 83  Resp: 18 16  Temp: 98.9 F (37.2 C) 97.5 F (36.4 C)   Vitals:   06/03/16 1930 06/03/16  2100 06/03/16 2142 06/04/16 0620  BP: (!) 164/85 (!) 156/99 (!) 157/80 140/65  Pulse: 87 (!) 103 92 83  Resp:  18 18 16   Temp:  98.8 F (37.1 C) 98.9 F (37.2 C) 97.5 F (36.4 C)  TempSrc:  Oral Oral Oral  SpO2: 95% 98% 95% 94%  Weight:      Height:        General: Pt is alert, awake, not in acute distress Cardiovascular: RRR, S1/S2 +, no rubs, no gallops Respiratory: CTA bilaterally, no wheezing, no rhonchi Abdominal: Soft, NT, ND, bowel sounds + Extremities: no edema, no cyanosis    The results of significant diagnostics from this hospitalization (including imaging, microbiology, ancillary and laboratory) are listed below for reference.     Microbiology: No results found for this or any previous visit (from the past 240 hour(s)).   Labs: BNP (last 3 results) No results for input(s): BNP in the last 8760 hours. Basic Metabolic Panel:  Recent Labs Lab 06/03/16 1302 06/04/16 1043  NA 137 136  K 2.9* 3.7  CL 99* 101  CO2 27 26  GLUCOSE 105* 94  BUN 13 10  CREATININE 0.85 0.70  CALCIUM 9.4 8.8*   Liver Function Tests:  Recent Labs Lab 06/03/16 1302  AST 40  ALT 10*  ALKPHOS 73  BILITOT 1.0  PROT 7.2  ALBUMIN 3.8    Recent Labs Lab 06/03/16 1302  LIPASE 15   No results for input(s): AMMONIA in the last 168 hours. CBC:  Recent Labs Lab 06/03/16 1302  WBC 7.0  NEUTROABS 4.3  HGB 15.4*  HCT 46.5*  MCV 92.3  PLT 315   Cardiac Enzymes: No results for input(s): CKTOTAL, CKMB, CKMBINDEX, TROPONINI in the last 168 hours. BNP: Invalid input(s): POCBNP CBG: No results for input(s): GLUCAP in the last 168 hours. D-Dimer No results for input(s): DDIMER in the last 72 hours. Hgb A1c No results for input(s): HGBA1C in the last 72 hours. Lipid Profile No results for input(s): CHOL, HDL, LDLCALC, TRIG, CHOLHDL, LDLDIRECT in the last 72 hours. Thyroid function studies No results for input(s): TSH, T4TOTAL, T3FREE, THYROIDAB in the last 72  hours.  Invalid input(s): FREET3 Anemia work up No results for input(s): VITAMINB12, FOLATE, FERRITIN, TIBC, IRON, RETICCTPCT in the last 72 hours. Urinalysis    Component Value Date/Time   COLORURINE YELLOW 06/03/2016 1255   APPEARANCEUR CLEAR 06/03/2016 1255   LABSPEC 1.017 06/03/2016 1255   PHURINE 6.0 06/03/2016 1255   GLUCOSEU NEGATIVE 06/03/2016 1255   HGBUR SMALL (A) 06/03/2016 1255   BILIRUBINUR NEGATIVE 06/03/2016 1255   KETONESUR 5 (A) 06/03/2016 1255   PROTEINUR NEGATIVE 06/03/2016 1255   NITRITE NEGATIVE 06/03/2016 1255   LEUKOCYTESUR NEGATIVE 06/03/2016 1255   Sepsis Labs Invalid input(s): PROCALCITONIN,  WBC,  LACTICIDVEN Microbiology No results found for this or any previous visit (from the past 240 hour(s)).   Time coordinating discharge: Over 30 minutes  SIGNED:   Kathie Dike, MD  [facility] 06/04/2016, 1:58 PM Pager   If 7PM-7AM, please contact night-coverage [email or web address] Password [email or web address]

## 2016-06-04 NOTE — Evaluation (Addendum)
Physical Therapy Evaluation Patient Details Name: Teresa Stevens MRN: [id number] DOB: 11-10-1924 Today's Date: 06/04/2016   History of Present Illness  83 y.o. female with hx of arthritis, chronic lower back pain, but worsen the past 2 weeks, HTN, pancreatitis, presented to the ER for back and abdominal pain.  She has no fever, chills, black for bloody stool.  Pain in non radiating.  She has no lower ext weakness, incontinence or retention.  Work up in the ER included a abdominal pelvic CT which showed questionable diverticulitis, but otherwise unremarkable.  She was started on IV Cipro and IV Flagyl, and hospitalist was asked to admit her for diverticulitis.  She has no fever or leukcocytosis.     Clinical Impression  Pt received in bed, and was agreeable to PT evaluation.  Pt's son, and caregiver both present in the room, and pt is very jovial, laughing and joking with family and friends today.  She uses a rollator walker with a seat for ambulation at home, and her caregiver is right there with her when she is ambulating.  She required Min A for sit<>stand due to need to motor plan how to stand with a front wheeled walker instead of the Rollator.  During gait, she takes very large, and ataxic steps with decreased gait speed, however both pt and caregiver state that this is her baseline.  Pt does not demonstrate need for skilled PT at this time, and therefore will d/c acute PT.      Follow Up Recommendations No PT follow up    Equipment Recommendations  None recommended by PT    Recommendations for Other Services       Precautions / Restrictions Precautions Precautions: Fall Restrictions Weight Bearing Restrictions: No      Mobility  Bed Mobility Overal bed mobility: Modified Independent                Transfers Overall transfer level: Needs assistance Equipment used: Rolling walker (2 wheeled) Transfers: Sit to/from Stand Sit to Stand: Min assist         General  transfer comment: Pt requires increased time for problem solving and motor planning for sit<>stand with RW instead of her rollator.  Pt normally pulls on her rollator and gets up easily.  RW was blocked, and pt used front bar to pull up on.    Ambulation/Gait Ambulation/Gait assistance: Min assist Ambulation Distance (Feet): 20 Feet Assistive device: Rolling walker (2 wheeled) Gait Pattern/deviations: Ataxic;Step-through pattern   Gait velocity interpretation: <1.8 ft/sec, indicative of risk for recurrent falls General Gait Details: Pt demonstrates very large steps with difficulty placing feet at times, and slow gait speed.    Stairs            Wheelchair Mobility    Modified Rankin (Stroke Patients Only)       Balance Overall balance assessment: Needs assistance Sitting-balance support: Bilateral upper extremity supported;Feet supported Sitting balance-Leahy Scale: Good     Standing balance support: Bilateral upper extremity supported Standing balance-Leahy Scale: Fair                               Pertinent Vitals/Pain Pain Assessment: No/denies pain    Home Living   Living Arrangements: Alone (Son lives next door. ) Available Help at Discharge: Personal care attendant (5 days per week for 8 hrs per day) Type of Home: House Home Access: [place]  Layout: One level Home Equipment: Bedside commode;Walker - 4 wheels;Cane - single point      Prior Function Level of Independence: Independent with assistive device(s)   Gait / Transfers Assistance Needed: Pt ambulates with Rollator and assistance of caregiver  ADL's / Homemaking Assistance Needed: independent with occasional assistance        Hand Dominance        Extremity/Trunk Assessment   Upper Extremity Assessment Upper Extremity Assessment: Overall WFL for tasks assessed    Lower Extremity Assessment Lower Extremity Assessment: Generalized weakness        Communication   Communication: No difficulties  Cognition Arousal/Alertness: Awake/alert Behavior During Therapy: WFL for tasks assessed/performed Overall Cognitive Status: Within Functional Limits for tasks assessed                                        General Comments      Exercises     Assessment/Plan    PT Assessment Patent does not need any further PT services  PT Problem List         PT Treatment Interventions      PT Goals (Current goals can be found in the Care Plan section)  Acute Rehab PT Goals Patient Stated Goal: To go home PT Goal Formulation: All assessment and education complete, DC therapy    Frequency     Barriers to discharge        Co-evaluation               End of Session Equipment Utilized During Treatment: Gait belt Activity Tolerance: Patient tolerated treatment well Patient left: in bed;with call bell/phone within reach;with family/visitor present Nurse Communication: Mobility status (mobility sheet left hanging in the room. ) PT Visit Diagnosis: Muscle weakness (generalized) (M62.81);Other abnormalities of gait and mobility (R26.89); ataxic gait (R26.0)    Time: 4825-0037 PT Time Calculation (min) (ACUTE ONLY): 27 min   Charges:   PT Evaluation $PT Eval Low Complexity: 1 Procedure PT Treatments $Gait Training: 8-22 mins   PT G Codes:   PT G-Codes **NOT FOR INPATIENT CLASS** Functional Assessment Tool Used: AM-PAC 6 Clicks Basic Mobility;Clinical judgement Functional Limitation: Mobility: Walking and moving around Mobility: Walking and Moving Around Current Status (C4888): At least 20 percent but less than 40 percent impaired, limited or restricted Mobility: Walking and Moving Around Goal Status [phone]): At least 20 percent but less than 40 percent impaired, limited or restricted Mobility: Walking and Moving Around Discharge Status [phone]): At least 20 percent but less than 40 percent impaired, limited or  restricted    Beth Maeci Kalbfleisch, PT, DPT X: [phone]

## 2016-06-05 LAB — URINE CULTURE: CULTURE: NO GROWTH

## 2016-06-23 DIAGNOSIS — Z961 Presence of intraocular lens: Secondary | ICD-10-CM | POA: Diagnosis not present

## 2016-06-23 DIAGNOSIS — H17823 Peripheral opacity of cornea, bilateral: Secondary | ICD-10-CM | POA: Diagnosis not present

## 2016-06-23 DIAGNOSIS — H04123 Dry eye syndrome of bilateral lacrimal glands: Secondary | ICD-10-CM | POA: Diagnosis not present

## 2017-01-27 DIAGNOSIS — Z1389 Encounter for screening for other disorder: Secondary | ICD-10-CM | POA: Diagnosis not present

## 2017-01-27 DIAGNOSIS — Z6831 Body mass index (BMI) 31.0-31.9, adult: Secondary | ICD-10-CM | POA: Diagnosis not present

## 2017-01-27 DIAGNOSIS — E6609 Other obesity due to excess calories: Secondary | ICD-10-CM | POA: Diagnosis not present

## 2017-01-27 DIAGNOSIS — M1991 Primary osteoarthritis, unspecified site: Secondary | ICD-10-CM | POA: Diagnosis not present

## 2017-01-27 DIAGNOSIS — I10 Essential (primary) hypertension: Secondary | ICD-10-CM | POA: Diagnosis not present

## 2017-01-27 DIAGNOSIS — Z23 Encounter for immunization: Secondary | ICD-10-CM | POA: Diagnosis not present

## 2017-06-10 DIAGNOSIS — H5022 Vertical strabismus, left eye: Secondary | ICD-10-CM | POA: Diagnosis not present

## 2017-06-10 DIAGNOSIS — H17823 Peripheral opacity of cornea, bilateral: Secondary | ICD-10-CM | POA: Diagnosis not present

## 2017-06-10 DIAGNOSIS — Z961 Presence of intraocular lens: Secondary | ICD-10-CM | POA: Diagnosis not present

## 2017-06-10 DIAGNOSIS — H532 Diplopia: Secondary | ICD-10-CM | POA: Diagnosis not present

## 2017-06-10 DIAGNOSIS — H04123 Dry eye syndrome of bilateral lacrimal glands: Secondary | ICD-10-CM | POA: Diagnosis not present

## 2017-06-10 DIAGNOSIS — H5052 Exophoria: Secondary | ICD-10-CM | POA: Diagnosis not present

## 2017-06-21 DIAGNOSIS — I10 Essential (primary) hypertension: Secondary | ICD-10-CM | POA: Diagnosis not present

## 2017-06-21 DIAGNOSIS — Z1389 Encounter for screening for other disorder: Secondary | ICD-10-CM | POA: Diagnosis not present

## 2017-06-21 DIAGNOSIS — Z8673 Personal history of transient ischemic attack (TIA), and cerebral infarction without residual deficits: Secondary | ICD-10-CM | POA: Diagnosis not present

## 2017-06-21 DIAGNOSIS — Z6831 Body mass index (BMI) 31.0-31.9, adult: Secondary | ICD-10-CM | POA: Diagnosis not present

## 2017-06-21 DIAGNOSIS — R7309 Other abnormal glucose: Secondary | ICD-10-CM | POA: Diagnosis not present

## 2017-07-02 DIAGNOSIS — R7309 Other abnormal glucose: Secondary | ICD-10-CM | POA: Diagnosis not present

## 2017-07-02 DIAGNOSIS — I6782 Cerebral ischemia: Secondary | ICD-10-CM | POA: Diagnosis not present

## 2017-07-02 DIAGNOSIS — Z1389 Encounter for screening for other disorder: Secondary | ICD-10-CM | POA: Diagnosis not present

## 2017-07-02 DIAGNOSIS — Z8673 Personal history of transient ischemic attack (TIA), and cerebral infarction without residual deficits: Secondary | ICD-10-CM | POA: Diagnosis not present

## 2017-07-02 DIAGNOSIS — I10 Essential (primary) hypertension: Secondary | ICD-10-CM | POA: Diagnosis not present

## 2017-07-02 DIAGNOSIS — H539 Unspecified visual disturbance: Secondary | ICD-10-CM | POA: Diagnosis not present

## 2017-07-20 DIAGNOSIS — Z6830 Body mass index (BMI) 30.0-30.9, adult: Secondary | ICD-10-CM | POA: Diagnosis not present

## 2017-07-20 DIAGNOSIS — E6609 Other obesity due to excess calories: Secondary | ICD-10-CM | POA: Diagnosis not present

## 2017-07-20 DIAGNOSIS — Z1389 Encounter for screening for other disorder: Secondary | ICD-10-CM | POA: Diagnosis not present

## 2017-07-20 DIAGNOSIS — M1991 Primary osteoarthritis, unspecified site: Secondary | ICD-10-CM | POA: Diagnosis not present

## 2017-07-20 DIAGNOSIS — M130 Polyarthritis, unspecified: Secondary | ICD-10-CM | POA: Diagnosis not present

## 2017-07-20 DIAGNOSIS — I10 Essential (primary) hypertension: Secondary | ICD-10-CM | POA: Diagnosis not present

## 2017-07-20 DIAGNOSIS — Z8673 Personal history of transient ischemic attack (TIA), and cerebral infarction without residual deficits: Secondary | ICD-10-CM | POA: Diagnosis not present

## 2017-08-15 IMAGING — CT CT ABD-PELV W/ CM
2 of 5 series · 16 of 46 positions shown, 18 images · IV contrast (Isovue)
Comparison: None.

CLINICAL DATA: Lower abdominal pain.  Reported hematuria.

EXAM:
CT ABDOMEN AND PELVIS WITH CONTRAST
TECHNIQUE: Multidetector CT imaging of the abdomen and pelvis was performed
using the standard protocol following bolus administration of
intravenous contrast.
CONTRAST:  100mL AOQSQK-NUU IOPAMIDOL (AOQSQK-NUU) INJECTION 61%

[Series 2: axial st · axial · 0.95mm/px · z∈[-689,-249]mm · 13 of 100 slices shown, 15 images]
[im 6/100  soft-tissue]
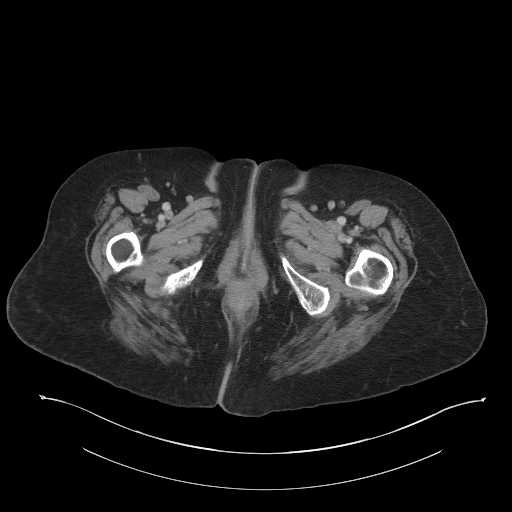
[im 6/100  bone]
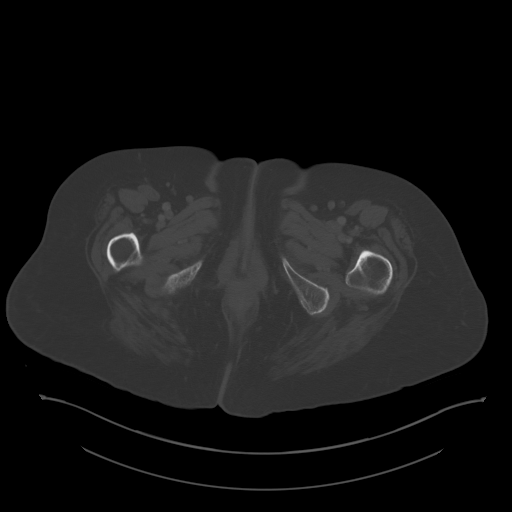
[im 12/100  soft-tissue]
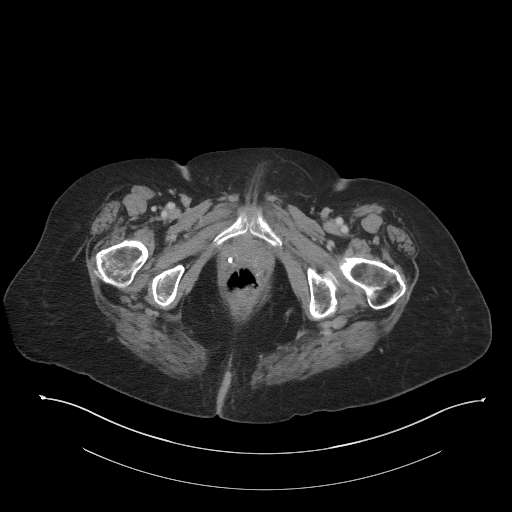
[im 24/100  soft-tissue]
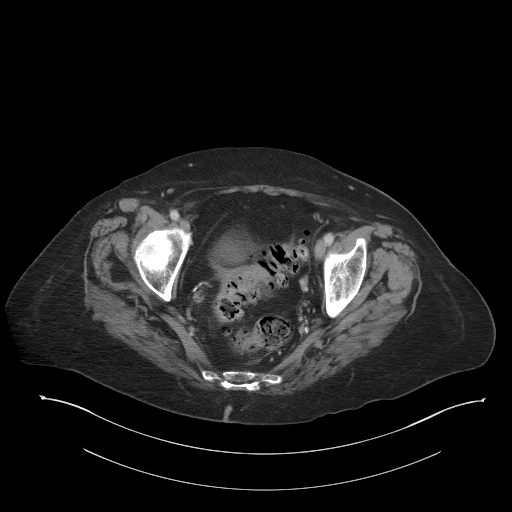
[im 30/100  soft-tissue]
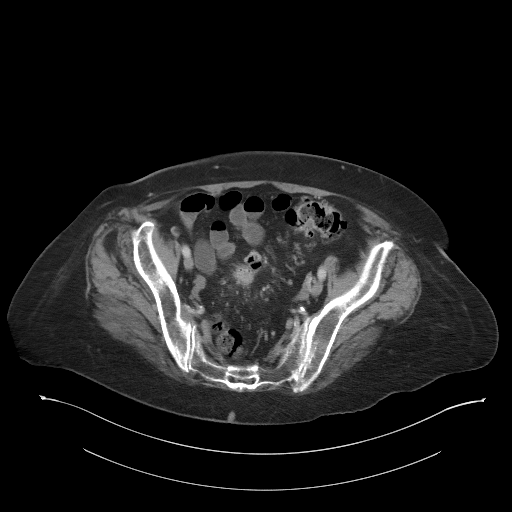
[im 35/100  soft-tissue]
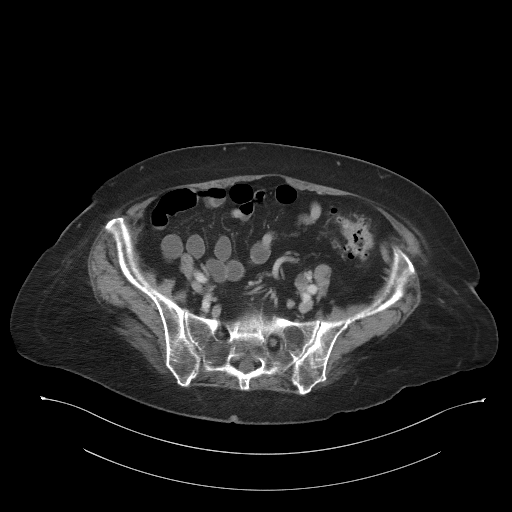
[im 41/100  soft-tissue]
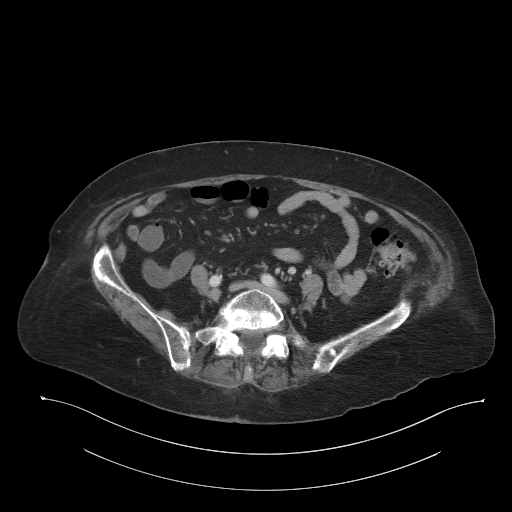
[im 53/100  soft-tissue]
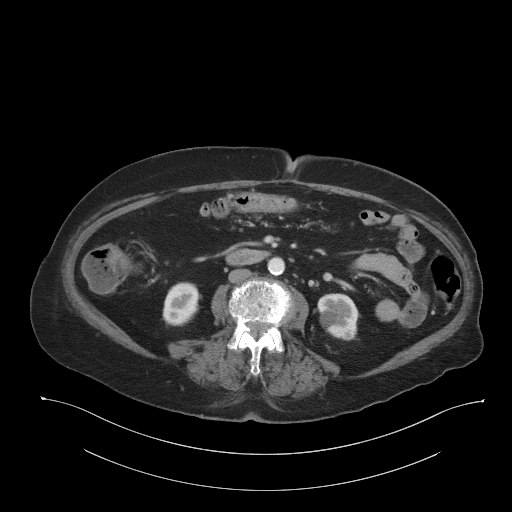
[im 59/100  soft-tissue]
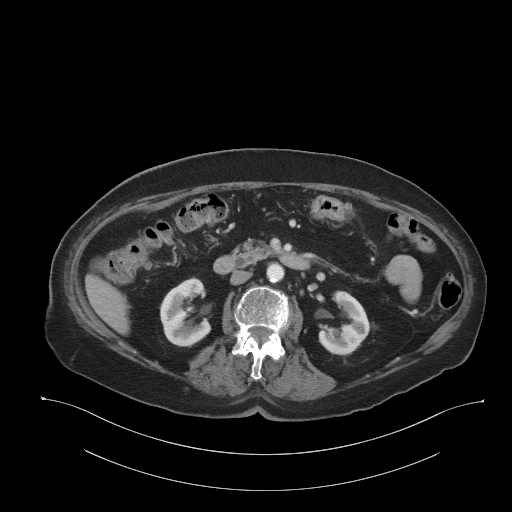
[im 65/100  soft-tissue]
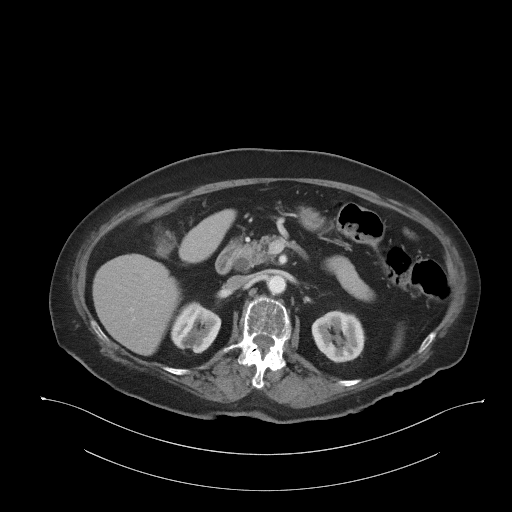
[im 65/100  bone]
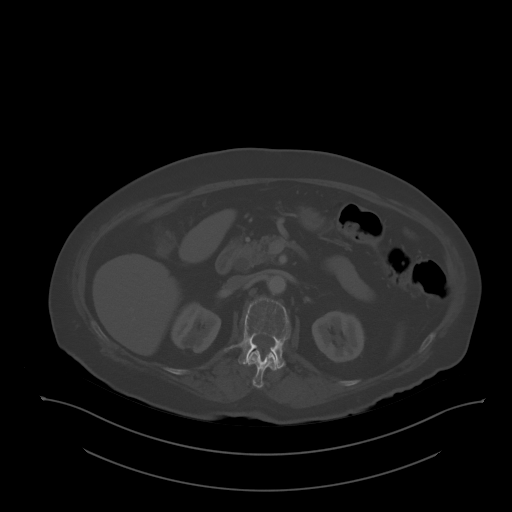
[im 70/100  soft-tissue]
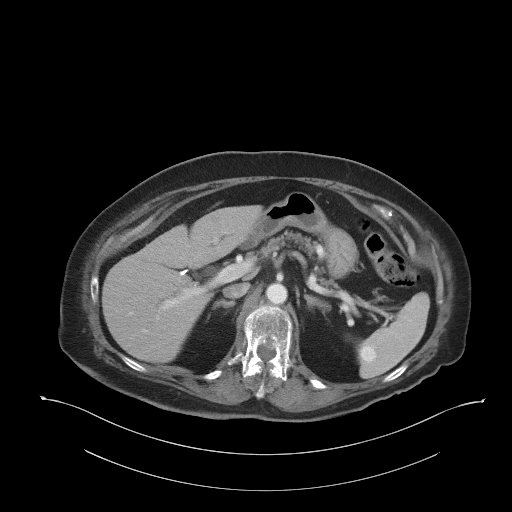
[im 76/100  soft-tissue]
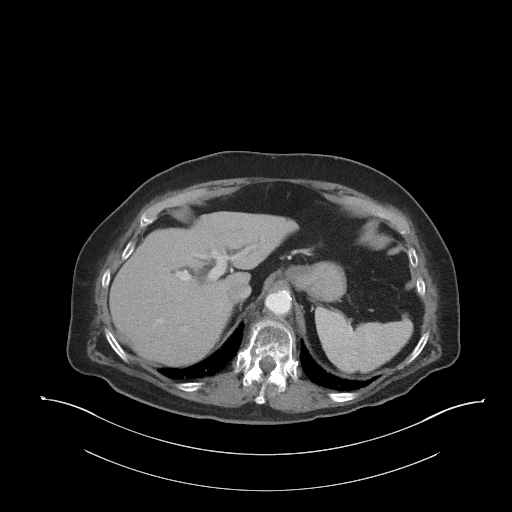
[im 88/100  soft-tissue]
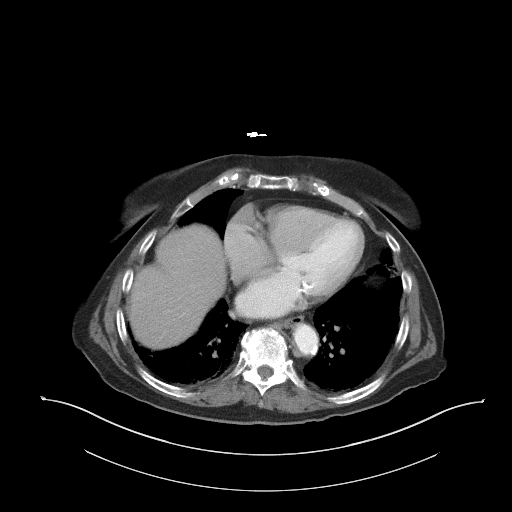
[im 94/100  soft-tissue]
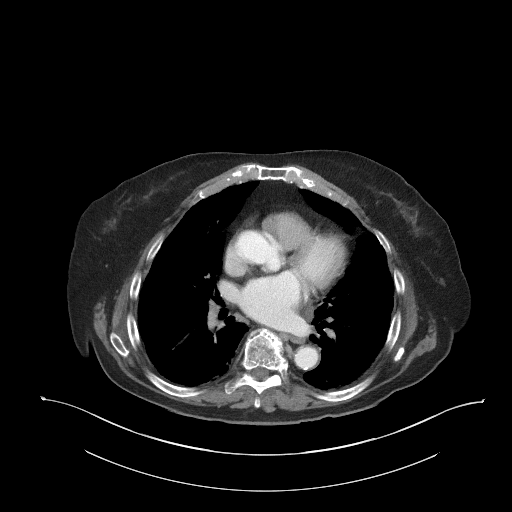

[Series 6: coronal st · coronal · 0.86mm/px · 3 of 115 slices shown]
[im 39/115  soft-tissue]
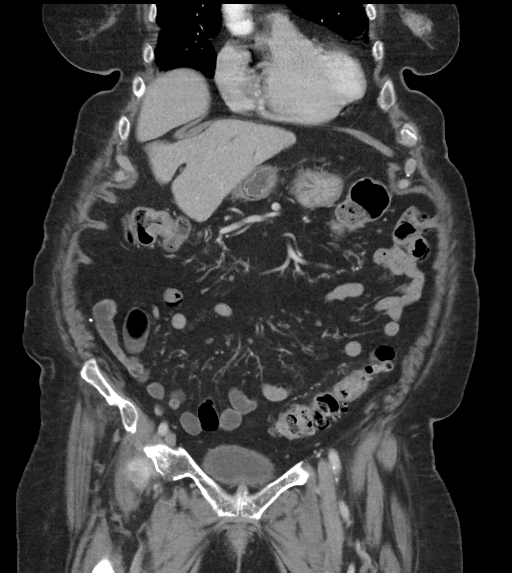
[im 51/115  soft-tissue]
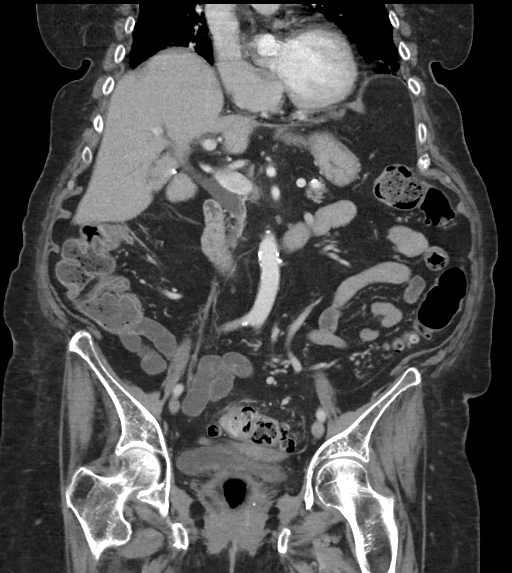
[im 64/115  soft-tissue]
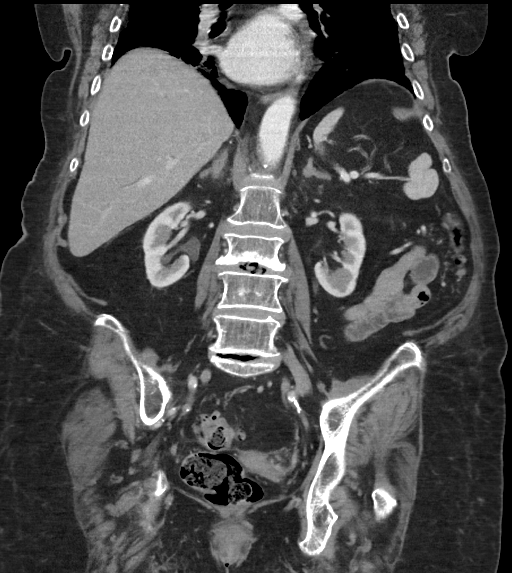

[16 of 46 positions shown; findings below may reference images not displayed]

FINDINGS: Lower chest: There are bibasilar multifocal reticular opacities and
septal thickening. No pleural effusion or pulmonary nodules.

Hepatobiliary: Normal hepatic size and contours without focal liver
lesion. No perihepatic ascites. No intra- or extrahepatic biliary
dilatation. Status post cholecystectomy.

Pancreas: Normal pancreatic contours and enhancement. No
peripancreatic fluid collection or pancreatic ductal dilatation.

Spleen: Contrast enhancing focus within the posterior aspect of the
spleen, measuring 1.5 cm

Adrenals/Urinary Tract: Normal adrenal glands. No hydronephrosis or
solid renal mass.

Stomach/Bowel: There is rectosigmoid diverticulosis with minimal
adjacent fat stranding but no free fluid. No dilated small bowel or
evidence of small bowel inflammation.

Vascular/Lymphatic: There is atherosclerotic calcification of the
non aneurysmal abdominal aorta. No abdominal or pelvic adenopathy.

Reproductive: Unremarkable uterus.  No adnexal mass.

Musculoskeletal: Large Schmorl's node at the inferior endplate of L3
associated underlying sclerosis. Age-indeterminate compression
fractures of T12 and L1. Normal visualized extrathoracic and
extraperitoneal soft tissues.

Other: Asymmetric and soft tissue within the left breast measuring
2.3 x 1.6 cm.
IMPRESSION: 1. Rectosigmoid diverticulosis with minimal surrounding in fat
infiltration. This appearance is nonspecific but, in the appropriate
context, could indicate early diverticulitis.
2. Age indeterminate compression fractures of T12 and L1. Given the
lack of surrounding soft tissue abnormality, these are not suspected
to be acute. If there is concern for an acute compression fracture,
MRI might be helpful.
3. Asymmetric soft tissue within the left breast. Recommend
correlation with prior mammograms, if available.
4. Enhancing lesion within the spleen, likely a hemangioma.

## 2017-09-01 DIAGNOSIS — Z8673 Personal history of transient ischemic attack (TIA), and cerebral infarction without residual deficits: Secondary | ICD-10-CM | POA: Diagnosis not present

## 2017-09-01 DIAGNOSIS — Z6830 Body mass index (BMI) 30.0-30.9, adult: Secondary | ICD-10-CM | POA: Diagnosis not present

## 2017-09-01 DIAGNOSIS — Z0001 Encounter for general adult medical examination with abnormal findings: Secondary | ICD-10-CM | POA: Diagnosis not present

## 2017-09-01 DIAGNOSIS — Z1389 Encounter for screening for other disorder: Secondary | ICD-10-CM | POA: Diagnosis not present

## 2017-09-01 DIAGNOSIS — R7309 Other abnormal glucose: Secondary | ICD-10-CM | POA: Diagnosis not present

## 2017-09-01 DIAGNOSIS — M1991 Primary osteoarthritis, unspecified site: Secondary | ICD-10-CM | POA: Diagnosis not present

## 2017-09-07 DIAGNOSIS — R7309 Other abnormal glucose: Secondary | ICD-10-CM | POA: Diagnosis not present

## 2017-11-08 ENCOUNTER — Other Ambulatory Visit: Payer: Self-pay

## 2017-11-08 ENCOUNTER — Emergency Department (HOSPITAL_COMMUNITY): Payer: Medicare Other

## 2017-11-08 ENCOUNTER — Encounter (HOSPITAL_COMMUNITY): Payer: Self-pay

## 2017-11-08 ENCOUNTER — Inpatient Hospital Stay (HOSPITAL_COMMUNITY)
Admission: EM | Admit: 2017-11-08 | Discharge: 2017-11-10 | DRG: 308 | Discharge status: Against Medical Advice | Payer: Medicare Other | Attending: Family Medicine | Admitting: Family Medicine

## 2017-11-08 DIAGNOSIS — Z880 Allergy status to penicillin: Secondary | ICD-10-CM

## 2017-11-08 DIAGNOSIS — J9601 Acute respiratory failure with hypoxia: Secondary | ICD-10-CM | POA: Diagnosis present

## 2017-11-08 DIAGNOSIS — I4891 Unspecified atrial fibrillation: Secondary | ICD-10-CM

## 2017-11-08 DIAGNOSIS — Z79899 Other long term (current) drug therapy: Secondary | ICD-10-CM

## 2017-11-08 DIAGNOSIS — E876 Hypokalemia: Secondary | ICD-10-CM | POA: Diagnosis not present

## 2017-11-08 DIAGNOSIS — I480 Paroxysmal atrial fibrillation: Secondary | ICD-10-CM | POA: Diagnosis not present

## 2017-11-08 DIAGNOSIS — R0902 Hypoxemia: Secondary | ICD-10-CM | POA: Diagnosis not present

## 2017-11-08 DIAGNOSIS — R05 Cough: Secondary | ICD-10-CM | POA: Diagnosis not present

## 2017-11-08 DIAGNOSIS — I5033 Acute on chronic diastolic (congestive) heart failure: Secondary | ICD-10-CM | POA: Diagnosis present

## 2017-11-08 DIAGNOSIS — I110 Hypertensive heart disease with heart failure: Secondary | ICD-10-CM | POA: Diagnosis present

## 2017-11-08 DIAGNOSIS — Z9049 Acquired absence of other specified parts of digestive tract: Secondary | ICD-10-CM

## 2017-11-08 DIAGNOSIS — I509 Heart failure, unspecified: Secondary | ICD-10-CM | POA: Diagnosis not present

## 2017-11-08 DIAGNOSIS — R0689 Other abnormalities of breathing: Secondary | ICD-10-CM | POA: Diagnosis not present

## 2017-11-08 DIAGNOSIS — H9193 Unspecified hearing loss, bilateral: Secondary | ICD-10-CM | POA: Diagnosis not present

## 2017-11-08 DIAGNOSIS — I10 Essential (primary) hypertension: Secondary | ICD-10-CM | POA: Diagnosis present

## 2017-11-08 DIAGNOSIS — R000 Tachycardia, unspecified: Secondary | ICD-10-CM | POA: Diagnosis not present

## 2017-11-08 LAB — COMPREHENSIVE METABOLIC PANEL
ALBUMIN: 2.6 g/dL — AB (ref 3.5–5.0)
ALT: 8 U/L (ref 0–44)
AST: 41 U/L (ref 15–41)
Alkaline Phosphatase: 63 U/L (ref 38–126)
Anion gap: 10 (ref 5–15)
BILIRUBIN TOTAL: 0.8 mg/dL (ref 0.3–1.2)
BUN: 15 mg/dL (ref 8–23)
CHLORIDE: 101 mmol/L (ref 98–111)
CO2: 25 mmol/L (ref 22–32)
Calcium: 8.1 mg/dL — ABNORMAL LOW (ref 8.9–10.3)
Creatinine, Ser: 0.75 mg/dL (ref 0.44–1.00)
GFR calc Af Amer: >60 mL/min (ref >60)
GLUCOSE: 110 mg/dL — AB (ref 70–99)
POTASSIUM: 2.7 mmol/L — AB (ref 3.5–5.1)
Sodium: 136 mmol/L (ref 135–145)
TOTAL PROTEIN: 5.7 g/dL — AB (ref 6.5–8.1)

## 2017-11-08 LAB — CBC WITH DIFFERENTIAL/PLATELET
BASOS ABS: 0 10*3/uL (ref 0.0–0.1)
BASOS PCT: 0 %
EOS ABS: 0.1 10*3/uL (ref 0.0–0.7)
EOS PCT: 1 %
HEMATOCRIT: 38.1 % (ref 36.0–46.0)
Hemoglobin: 12.2 g/dL (ref 12.0–15.0)
Lymphocytes Relative: 12 %
Lymphs Abs: 1.2 10*3/uL (ref 0.7–4.0)
MCH: 30.2 pg (ref 26.0–34.0)
MCHC: 32 g/dL (ref 30.0–36.0)
MCV: 94.3 fL (ref 78.0–100.0)
MONO ABS: 2.2 10*3/uL — AB (ref 0.1–1.0)
MONOS PCT: 23 %
NEUTROS ABS: 6.2 10*3/uL (ref 1.7–7.7)
Neutrophils Relative %: 64 %
PLATELETS: 288 10*3/uL (ref 150–400)
RBC: 4.04 MIL/uL (ref 3.87–5.11)
RDW: 15.8 % — AB (ref 11.5–15.5)
WBC: 9.6 10*3/uL (ref 4.0–10.5)

## 2017-11-08 LAB — URINALYSIS, ROUTINE W REFLEX MICROSCOPIC
GLUCOSE, UA: NEGATIVE mg/dL
KETONES UR: 5 mg/dL — AB
LEUKOCYTES UA: NEGATIVE
NITRITE: NEGATIVE
PH: 5 (ref 5.0–8.0)
PROTEIN: 100 mg/dL — AB
Specific Gravity, Urine: 1.027 (ref 1.005–1.030)

## 2017-11-08 LAB — BRAIN NATRIURETIC PEPTIDE: B Natriuretic Peptide: 312 pg/mL — ABNORMAL HIGH (ref 0.0–100.0)

## 2017-11-08 LAB — MAGNESIUM: MAGNESIUM: 1.6 mg/dL — AB (ref 1.7–2.4)

## 2017-11-08 LAB — TROPONIN I: Troponin I: <0.03 ng/mL (ref <0.03)

## 2017-11-08 LAB — TSH: TSH: 0.682 u[IU]/mL (ref 0.350–4.500)

## 2017-11-08 MED ORDER — FUROSEMIDE 10 MG/ML IJ SOLN
20.0000 mg | Freq: Once | INTRAMUSCULAR | Status: AC
  Administered 2017-11-08: 20 mg via INTRAVENOUS
  Filled 2017-11-08: qty 2

## 2017-11-08 MED ORDER — DILTIAZEM HCL-DEXTROSE 100-5 MG/100ML-% IV SOLN (PREMIX)
5.0000 mg/h | INTRAVENOUS | Status: DC
Start: 2017-11-08 — End: 2017-11-08
  Administered 2017-11-08: 5 mg/h via INTRAVENOUS
  Filled 2017-11-08: qty 100

## 2017-11-08 MED ORDER — ACETAMINOPHEN 325 MG PO TABS: 650.0000 mg | ORAL_TABLET | Freq: Four times a day (QID) | ORAL | Status: DC | PRN

## 2017-11-08 MED ORDER — SODIUM CHLORIDE 0.9% FLUSH: 3.0000 mL | INTRAVENOUS | Status: DC | PRN

## 2017-11-08 MED ORDER — SODIUM CHLORIDE 0.9 % IV SOLN: 250.0000 mL | INTRAVENOUS | Status: DC | PRN

## 2017-11-08 MED ORDER — INFLUENZA VAC SPLIT HIGH-DOSE 0.5 ML IM SUSY: 0.5000 mL | PREFILLED_SYRINGE | INTRAMUSCULAR | Status: DC

## 2017-11-08 MED ORDER — ACETAMINOPHEN 650 MG RE SUPP: 650.0000 mg | Freq: Four times a day (QID) | RECTAL | Status: DC | PRN

## 2017-11-08 MED ORDER — DILTIAZEM HCL 30 MG PO TABS
180.0000 mg | ORAL_TABLET | Freq: Once | ORAL | Status: AC
  Administered 2017-11-08: 180 mg via ORAL
  Filled 2017-11-08: qty 6

## 2017-11-08 MED ORDER — MAGNESIUM SULFATE 2 GM/50ML IV SOLN
2.0000 g | Freq: Once | INTRAVENOUS | Status: AC
  Administered 2017-11-08: 2 g via INTRAVENOUS
  Filled 2017-11-08: qty 50

## 2017-11-08 MED ORDER — POTASSIUM CHLORIDE CRYS ER 20 MEQ PO TBCR
40.0000 meq | EXTENDED_RELEASE_TABLET | Freq: Once | ORAL | Status: AC
  Administered 2017-11-08: 40 meq via ORAL
  Filled 2017-11-08: qty 2

## 2017-11-08 MED ORDER — ONDANSETRON HCL 4 MG/2ML IJ SOLN: 4.0000 mg | Freq: Four times a day (QID) | INTRAMUSCULAR | Status: DC | PRN

## 2017-11-08 MED ORDER — SODIUM CHLORIDE 0.9% FLUSH
3.0000 mL | Freq: Two times a day (BID) | INTRAVENOUS | Status: DC
  Administered 2017-11-08 – 2017-11-09 (×4): 3 mL via INTRAVENOUS

## 2017-11-08 MED ORDER — ONDANSETRON HCL 4 MG PO TABS: 4.0000 mg | ORAL_TABLET | Freq: Four times a day (QID) | ORAL | Status: DC | PRN

## 2017-11-08 MED ORDER — DILTIAZEM HCL ER COATED BEADS 180 MG PO CP24
180.0000 mg | ORAL_CAPSULE | Freq: Every day | ORAL | Status: DC
  Administered 2017-11-08 – 2017-11-09 (×2): 180 mg via ORAL
  Filled 2017-11-08 (×2): qty 1

## 2017-11-08 MED ORDER — POTASSIUM CHLORIDE 10 MEQ/100ML IV SOLN
10.0000 meq | INTRAVENOUS | Status: DC
  Administered 2017-11-08: 10 meq via INTRAVENOUS
  Filled 2017-11-08: qty 100

## 2017-11-08 MED ORDER — APIXABAN 5 MG PO TABS
5.0000 mg | ORAL_TABLET | Freq: Two times a day (BID) | ORAL | Status: DC
  Administered 2017-11-08: 5 mg via ORAL
  Filled 2017-11-08: qty 1

## 2017-11-08 MED ORDER — APIXABAN 5 MG PO TABS
5.0000 mg | ORAL_TABLET | Freq: Two times a day (BID) | ORAL | Status: DC
  Administered 2017-11-08 – 2017-11-09 (×3): 5 mg via ORAL
  Filled 2017-11-08 (×5): qty 1

## 2017-11-08 MED ORDER — DILTIAZEM LOAD VIA INFUSION
15.0000 mg | Freq: Once | INTRAVENOUS | Status: AC
  Administered 2017-11-08: 15 mg via INTRAVENOUS
  Filled 2017-11-08: qty 15

## 2017-11-08 MED ORDER — SENNOSIDES-DOCUSATE SODIUM 8.6-50 MG PO TABS: 1.0000 | ORAL_TABLET | Freq: Every evening | ORAL | Status: DC | PRN

## 2017-11-08 MED ORDER — POTASSIUM CHLORIDE 10 MEQ/100ML IV SOLN
10.0000 meq | Freq: Once | INTRAVENOUS | Status: AC
  Administered 2017-11-08: 10 meq via INTRAVENOUS
  Filled 2017-11-08: qty 100

## 2017-11-08 MED ORDER — POTASSIUM CHLORIDE CRYS ER 20 MEQ PO TBCR
40.0000 meq | EXTENDED_RELEASE_TABLET | ORAL | Status: AC
  Administered 2017-11-08 – 2017-11-09 (×3): 40 meq via ORAL
  Filled 2017-11-08 (×3): qty 2

## 2017-11-08 MED ORDER — ORAL CARE MOUTH RINSE: 15.0000 mL | Freq: Two times a day (BID) | OROMUCOSAL | Status: DC

## 2017-11-08 NOTE — ED Provider Notes (Signed)
Emergency Department Provider Note   I have reviewed the triage vital signs and the nursing notes.   HISTORY  Chief Complaint No chief complaint on file.   HPI Teresa Stevens is a 84 y.o. female who has a chief complaint of anxiety but is really here for shortness of breath and cough.  Patient states and her family helps, that she has had a dry cough for couple weeks but then today she got significantly worsening shortness of breath which caused her to have some anxiety with it.  EMS got there and states that when they brought her to the truck she was doing better.  Patient denies any leg swelling, abdominal swelling or positional changes with her shortness of breath.  Does not feel any chest pain or palpitations.  No fever or productive cough.  States compliance with her medications for hypertension. No other associated or modifying symptoms.    Past Medical History:  Diagnosis Date  . Arthritis   . Hypertension   . Pancreatitis     Patient Active Problem List   Diagnosis Date Noted  . Atrial fibrillation with RVR ([place]) 11/08/2017  . Hypokalemia 11/08/2017  . Hypomagnesemia 11/08/2017  . Diverticulitis large intestine 06/03/2016  . Chronic low back pain 06/03/2016  . Essential hypertension 06/03/2016  . Diverticulitis 06/03/2016    Past Surgical History:  Procedure Laterality Date  . CHOLECYSTECTOMY        Allergies Penicillins  No family history on file.  Social History Social History   Tobacco Use  . Smoking status: Never Smoker  . Smokeless tobacco: Never Used  Substance Use Topics  . Alcohol use: No  . Drug use: No    Review of Systems  All other systems negative except as documented in the HPI. All pertinent positives and negatives as reviewed in the HPI. ____________________________________________   PHYSICAL EXAM:  VITAL SIGNS: ED Triage Vitals  Enc Vitals Group     BP 11/08/17 0652 127/79     Pulse Rate 11/08/17 0652 (!) 133     Resp  11/08/17 0652 20     Temp 11/08/17 0652 99.2 F (37.3 C)     Temp Source 11/08/17 0652 Oral     SpO2 11/08/17 0652 (!) 89 %     Weight 11/08/17 0654 182 lb 1.6 oz (82.6 kg)     Height --      Head Circumference --      Peak Flow --      Pain Score 11/08/17 0654 0     Pain Loc --      Pain Edu? --      Excl. in [place]? --     Constitutional: Alert and oriented. Well appearing and in no acute distress. Eyes: Conjunctivae are normal. PERRL. EOMI. Head: Atraumatic. Nose: No congestion/rhinnorhea. Mouth/Throat: Mucous membranes are moist.  Oropharynx non-erythematous. Neck: No stridor.  No meningeal signs.   Cardiovascular: tachycardic rate, irregular rhythm. Good peripheral circulation. Grossly normal heart sounds.   Respiratory: tachypneic, rales bilateral bases.  No retractions. Gastrointestinal: Soft and nontender. No distention.  Musculoskeletal: No lower extremity tenderness nor edema. No gross deformities of extremities. Neurologic:  Normal speech and language. No gross focal neurologic deficits are appreciated.  Skin:  Skin is warm, dry and intact. No rash noted.   ____________________________________________   LABS (all labs ordered are listed, but only abnormal results are displayed)  Labs Reviewed  URINALYSIS, ROUTINE W REFLEX MICROSCOPIC - Abnormal; Notable for the following components:  Result Value   Color, Urine AMBER (*)    APPearance HAZY (*)    Hgb urine dipstick SMALL (*)    Bilirubin Urine SMALL (*)    Ketones, ur 5 (*)    Protein, ur 100 (*)    Bacteria, UA RARE (*)    All other components within normal limits  CBC WITH DIFFERENTIAL/PLATELET - Abnormal; Notable for the following components:   RDW 15.8 (*)    Monocytes Absolute 2.2 (*)    All other components within normal limits  COMPREHENSIVE METABOLIC PANEL - Abnormal; Notable for the following components:   Potassium 2.7 (*)    Glucose, Bld 110 (*)    Calcium 8.1 (*)    Total Protein 5.7 (*)     Albumin 2.6 (*)    All other components within normal limits  BRAIN NATRIURETIC PEPTIDE - Abnormal; Notable for the following components:   B Natriuretic Peptide 312.0 (*)    All other components within normal limits  MAGNESIUM - Abnormal; Notable for the following components:   Magnesium 1.6 (*)    All other components within normal limits  TROPONIN I  TSH   ____________________________________________  EKG   EKG Interpretation  Date/Time:  Monday November 08 2017 06:54:15 EDT Ventricular Rate:  143 PR Interval:    QRS Duration: 83 QT Interval:  213 QTC Calculation: 329 R Axis:   16 Text Interpretation:  Atrial fibrillation with rapid V-rate Repolarization abnormality, prob rate related No old tracing to compare Confirmed by Delora Fuel ([id number]) on 11/08/2017 6:59:50 AM       ____________________________________________  RADIOLOGY  Dg Chest 2 View  Result Date: 11/08/2017 CLINICAL DATA:  84 year old female with nonproductive cough for several weeks. Initial encounter. EXAM: CHEST - 2 VIEW COMPARISON:  06/03/2016 chest x-ray.  06/03/2016 CT abdomen pelvis. FINDINGS: Pulmonary vascular congestion superimposed upon chronic lung changes similar to prior exam. No obvious segmental consolidation although evaluation limited for detection of subtle infiltrate given the degree of chronic lung changes. No pneumothorax. Cardiomegaly.  Calcified mildly tortuous aorta. Remote T12 anterior wedge compression fracture with 50% loss of height anteriorly and 20% loss of height posteriorly. Further compression of L1 vertebral body with 75% loss of height anteriorly and 50% loss of height posteriorly. IMPRESSION: 1. Pulmonary vascular congestion superimposed upon chronic lung changes similar to prior exam. 2. No obvious segmental consolidation although evaluation limited for detection of subtle infiltrate given the degree of chronic lung changes. 3. Cardiomegaly. 4.  Aortic Atherosclerosis  (ICD10-I70.0). 5. Similar appearance T12 compression deformity. Further compression of L1 vertebral body as detailed above. Electronically Signed   By: Genia Del M.D.   On: 11/08/2017 09:37    ____________________________________________   PROCEDURES  Procedure(s) performed:   Procedures  CRITICAL CARE Performed by: Merrily Pew Total critical care time: 35 minutes Critical care time was exclusive of separately billable procedures and treating other patients. Critical care was necessary to treat or prevent imminent or life-threatening deterioration. Critical care was time spent personally by me on the following activities: development of treatment plan with patient and/or surrogate as well as nursing, discussions with consultants, evaluation of patient's response to treatment, examination of patient, obtaining history from patient or surrogate, ordering and performing treatments and interventions, ordering and review of laboratory studies, ordering and review of radiographic studies, pulse oximetry and re-evaluation of patient's condition.  ____________________________________________   INITIAL IMPRESSION / ASSESSMENT AND PLAN / ED COURSE  Suspect patient is in A. fib with RVR  for the last week or 2 this caused her dry cough and fluid overload.  Will check labs for the same.  Low suspicion for pneumonia at this time will wait for chest x-ray prior to initiate treatment.  X-ray does look like she has pulmonary edema.  This in association with her BNP of about 300 and crackles on exam I think heart failure is likely cause for symptoms.  Will start on a diltiazem bolus and infusion.  We will hold on Lasix for now until I speak with the hospitalist as I think that get her rate down and in a better rhythm will likely get the fluid off better than the Lasix will.  After discussion with hospitalist, will give a dose of Lasix and try to transition over to oral diltiazem. Extra potassium  ordered. Magnesium as well.    Pertinent labs & imaging results that were available during my care of the patient were reviewed by me and considered in my medical decision making (see chart for details).  ____________________________________________  FINAL CLINICAL IMPRESSION(S) / ED DIAGNOSES  Final diagnoses:  Hypokalemia  Hypomagnesemia  Atrial fibrillation with rapid ventricular response (HCC)  Heart failure, unspecified HF chronicity, unspecified heart failure type ([place])    MEDICATIONS GIVEN DURING THIS VISIT:  Medications  diltiazem (CARDIZEM CD) 24 hr capsule 180 mg (has no administration in time range)  sodium chloride flush (NS) 0.9 % injection 3 mL (has no administration in time range)  sodium chloride flush (NS) 0.9 % injection 3 mL (has no administration in time range)  0.9 %  sodium chloride infusion (has no administration in time range)  acetaminophen (TYLENOL) tablet 650 mg (has no administration in time range)    Or  acetaminophen (TYLENOL) suppository 650 mg (has no administration in time range)  senna-docusate (Senokot-S) tablet 1 tablet (has no administration in time range)  ondansetron (ZOFRAN) tablet 4 mg (has no administration in time range)    Or  ondansetron (ZOFRAN) injection 4 mg (has no administration in time range)  apixaban (ELIQUIS) tablet 5 mg (has no administration in time range)  potassium chloride SA (K-DUR,KLOR-CON) CR tablet 40 mEq (has no administration in time range)  diltiazem (CARDIZEM) 1 mg/mL load via infusion 15 mg (15 mg Intravenous Bolus from Bag 11/08/17 0853)  potassium chloride 10 mEq in 100 mL IVPB (0 mEq Intravenous Stopped 11/08/17 0953)  potassium chloride SA (K-DUR,KLOR-CON) CR tablet 40 mEq (40 mEq Oral Given 11/08/17 0859)  magnesium sulfate IVPB 2 g 50 mL (0 g Intravenous Stopped 11/08/17 1025)  diltiazem (CARDIZEM) tablet 180 mg (180 mg Oral Given 11/08/17 0953)  furosemide (LASIX) injection 20 mg (20 mg Intravenous Given 11/08/17  0953)     NEW OUTPATIENT MEDICATIONS STARTED DURING THIS VISIT:  Current Discharge Medication List      Note:  This note was prepared with assistance of Dragon voice recognition software. Occasional wrong-word or sound-a-like substitutions may have occurred due to the inherent limitations of voice recognition software.   Merrily Pew, MD 11/08/17 [phone]

## 2017-11-08 NOTE — Progress Notes (Signed)
ANTICOAGULATION CONSULT NOTE - Initial Consult  Pharmacy Consult for apixaban Indication: atrial fibrillation  Allergies  Allergen Reactions  . Penicillins Rash    Patient Measurements: Weight: 182 lb 1.6 oz (82.6 kg)   Vital Signs: Temp: 99.2 F (37.3 C) (09/09 0652) Temp Source: Oral (09/09 0652) BP: 121/66 (09/09 0730) Pulse Rate: 124 (09/09 0730)  Labs: Recent Labs    11/08/17 0735  HGB 12.2  HCT 38.1  PLT 288  CREATININE 0.75  TROPONINI <0.03    CrCl cannot be calculated (Unknown ideal weight.).   Medical History: Past Medical History:  Diagnosis Date  . Arthritis   . Hypertension   . Pancreatitis     Medications:   (Not in a hospital admission)  Assessment: Pharmacy consulted to dose apixaban for patient with atrial fibrillation.  Goal of Therapy:   Monitor platelets by anticoagulation protocol: Yes   Plan:  Apixaban 5 mg twice daily Monitor labs and s/s of bleeding  Ramond Craver 11/08/2017,8:39 AM

## 2017-11-08 NOTE — ED Notes (Addendum)
CRITICAL VALUE ALERT  Critical Value:  K+ 2.7  Date & Time Notied:  11/08/17 0809   Provider Notified: Dr. Dayna Barker   Orders Received/Actions taken: None yet

## 2017-11-08 NOTE — Discharge Instructions (Addendum)
1)You Refused Physical Therapy Evaluation--- Please have your Primary Physician as soon as possible to help set up home Physical Therapy 2)You are taking a blood Thinner so Avoid ibuprofen/Advil/Aleve/Motrin/Goody Powders/Naproxen/BC powders/Meloxicam/Diclofenac/Indomethacin and other Nonsteroidal anti-inflammatory medications as these will make you more likely to bleed and can cause stomach ulcers, can also cause Kidney problems.  3)You have Atrial Fibrillation  4)You have decided to leave the [place] Advice 5)You Need Repeat BMP and CBC Blood test with your Primary physician in 5 to 7 days    Information on my medicine - ELIQUIS (apixaban)  This medication education was reviewed with me or my healthcare representative as part of my discharge preparation.   Why was Eliquis prescribed for you? Eliquis was prescribed for you to reduce the risk of a blood clot forming that can cause a stroke if you have a medical condition called atrial fibrillation (a type of irregular heartbeat).  What do You need to know about Eliquis ? Take your Eliquis TWICE DAILY - one tablet in the morning and one tablet in the evening with or without food. If you have difficulty swallowing the tablet whole please discuss with your pharmacist how to take the medication safely.  Take Eliquis exactly as prescribed by your doctor and DO NOT stop taking Eliquis without talking to the doctor who prescribed the medication.  Stopping may increase your risk of developing a stroke.  Refill your prescription before you run out.  After discharge, you should have regular check-up appointments with your healthcare provider that is prescribing your Eliquis.  In the future your dose may need to be changed if your kidney function or weight changes by a significant amount or as you get older.  What do you do if you miss a dose? If you miss a dose, take it as soon as you remember on the same day and resume taking  twice daily.  Do not take more than one dose of ELIQUIS at the same time to make up a missed dose.  Important Safety Information A possible side effect of Eliquis is bleeding. You should call your healthcare provider right away if you experience any of the following: ? Bleeding from an injury or your nose that does not stop. ? Unusual colored urine (red or dark brown) or unusual colored stools (red or black). ? Unusual bruising for unknown reasons. ? A serious fall or if you hit your head (even if there is no bleeding).  Some medicines may interact with Eliquis and might increase your risk of bleeding or clotting while on Eliquis. To help avoid this, consult your healthcare provider or pharmacist prior to using any new prescription or non-prescription medications, including herbals, vitamins, non-steroidal anti-inflammatory drugs (NSAIDs) and supplements.  This website has more information on Eliquis (apixaban): http://www.eliquis.com/eliquis/home  1)You Refused Physical Therapy Evaluation--- Please have your Primary Physician as soon as possible to help set up home Physical Therapy 2)You are taking a blood Thinner so Avoid ibuprofen/Advil/Aleve/Motrin/Goody Powders/Naproxen/BC powders/Meloxicam/Diclofenac/Indomethacin and other Nonsteroidal anti-inflammatory medications as these will make you more likely to bleed and can cause stomach ulcers, can also cause Kidney problems.  3)You have Atrial Fibrillation  4)You have decided to leave the [place] Advice 5)You Need Repeat BMP and CBC Blood test with your Primary physician in 5 to 7 days

## 2017-11-08 NOTE — ED Triage Notes (Signed)
Pt reports and a dry cough for several weeks and pt reports SOB "with this cough, b/c she can't stop coughing"  EMS reports when they arrived pt was anxious and "flopping around like a fish", (son was present upon arrival and lives beside of pt) but as soon as they got her out of house and in EMS truck, she was fine.

## 2017-11-08 NOTE — H&P (Signed)
History and Physical    Teresa Stevens TDD:[id number] DOB: [birth year] DOA: 11/08/2017  Referring MD/NP/PA: Merrily Pew, EDP PCP: Sharilyn Sites, MD  Patient coming from: Home  Chief Complaint: Palpitations  HPI: Teresa Stevens is a 84 y.o. female who is remarkably healthy and in good shape, active despite her 47 years, with only past medical history significant for hypertension for which she is on diltiazem comes into the hospital today with complaints of palpitations.  For the past 3 weeks she has been having a dry cough and today noticed a feeling of her chest fluttering and came to the hospital for evaluation.  She was found to have an initial heart rate between 140 and 150.  She was also found to have electrolyte abnormalities including hypokalemia and hypomagnesemia with a K of 2.7 and a mag of 1.6.  Admission has been requested for further evaluation and management.  Past Medical/Surgical History: Past Medical History:  Diagnosis Date  . Arthritis   . Hypertension   . Pancreatitis     Past Surgical History:  Procedure Laterality Date  . CHOLECYSTECTOMY      Social History:  reports that she has never smoked. She has never used smokeless tobacco. She reports that she does not drink alcohol or use drugs.  Allergies: Allergies  Allergen Reactions  . Penicillins Rash    Family History:  She is unaware of any family history of significance, at over 60 years old doubt this is of any clinical significance.  Prior to Admission medications   Medication Sig Start Date End Date Taking? Authorizing Provider  diltiazem (DILTIAZEM CD) 180 MG 24 hr capsule Take 180 mg by mouth at bedtime.   Yes [provider]    Review of Systems:  Constitutional: Denies fever, chills, diaphoresis, appetite change and fatigue.  HEENT: Denies photophobia, eye pain, redness, hearing loss, ear pain, congestion, sore throat, rhinorrhea, sneezing, mouth sores, trouble swallowing, neck pain, neck  stiffness and tinnitus.   Respiratory: Denies SOB, DOE, cough, chest tightness,  and wheezing.   Cardiovascular: Denies chest pain and leg swelling.  Gastrointestinal: Denies nausea, vomiting, abdominal pain, diarrhea, constipation, blood in stool and abdominal distention.  Genitourinary: Denies dysuria, urgency, frequency, hematuria, flank pain and difficulty urinating.  Endocrine: Denies: hot or cold intolerance, sweats, changes in hair or nails, polyuria, polydipsia. Musculoskeletal: Denies myalgias, back pain, joint swelling, arthralgias and gait problem.  Skin: Denies pallor, rash and wound.  Neurological: Denies dizziness, seizures, syncope, weakness, light-headedness, numbness and headaches.  Hematological: Denies adenopathy. Easy bruising, personal or family bleeding history  Psychiatric/Behavioral: Denies suicidal ideation, mood changes, confusion, nervousness, sleep disturbance and agitation    Physical Exam: Vitals:   11/08/17 1030 11/08/17 1147 11/08/17 1200 11/08/17 1216  BP: 137/82  (!) 106/59   Pulse: 86  (!) 104 89  Resp: 20     Temp:   97.6 F (36.4 C)   TempSrc:   Axillary   SpO2: 97%  96%   Weight:  72.1 kg    Height:  5\' 4"  (1.626 m)       Constitutional: NAD, calm, comfortable, hard of hearing, pleasant and cooperative with exam Eyes: PERRL, lids and conjunctivae normal ENMT: Mucous membranes are moist. Posterior pharynx clear of any exudate or lesions.Normal dentition.  Neck: normal, supple, no masses, no thyromegaly Respiratory: clear to auscultation bilaterally, no wheezing, no crackles. Normal respiratory effort. No accessory muscle use.  Cardiovascular: Irregular rhythm, regular rate, no murmurs / rubs / gallops.  No extremity edema. 2+ pedal pulses. No carotid bruits.  Abdomen: no tenderness, no masses palpated. No hepatosplenomegaly. Bowel sounds positive.  Musculoskeletal: no clubbing / cyanosis. No joint deformity upper and lower extremities. Good ROM,  no contractures. Normal muscle tone.  Skin: no rashes, lesions, ulcers. No induration Neurologic: CN 2-12 grossly intact. Sensation intact, DTR normal. Strength 5/5 in all 4.  Psychiatric: Normal judgment and insight. Alert and oriented x 3. Normal mood.    Labs on Admission: I have personally reviewed the following labs and imaging studies  CBC: Recent Labs  Lab 11/08/17 0735  WBC 9.6  NEUTROABS 6.2  HGB 12.2  HCT 38.1  MCV 94.3  PLT 245   Basic Metabolic Panel: Recent Labs  Lab 11/08/17 0735 11/08/17 0853  NA 136  --   K 2.7*  --   CL 101  --   CO2 25  --   GLUCOSE 110*  --   BUN 15  --   CREATININE 0.75  --   CALCIUM 8.1*  --   MG  --  1.6*   GFR: Estimated Creatinine Clearance: 44.6 mL/min (by C-G formula based on SCr of 0.75 mg/dL). Liver Function Tests: Recent Labs  Lab 11/08/17 0735  AST 41  ALT 8  ALKPHOS 63  BILITOT 0.8  PROT 5.7*  ALBUMIN 2.6*   No results for input(s): LIPASE, AMYLASE in the last 168 hours. No results for input(s): AMMONIA in the last 168 hours. Coagulation Profile: No results for input(s): INR, PROTIME in the last 168 hours. Cardiac Enzymes: Recent Labs  Lab 11/08/17 0735  TROPONINI <0.03   BNP (last 3 results) No results for input(s): PROBNP in the last 8760 hours. HbA1C: No results for input(s): HGBA1C in the last 72 hours. CBG: No results for input(s): GLUCAP in the last 168 hours. Lipid Profile: No results for input(s): CHOL, HDL, LDLCALC, TRIG, CHOLHDL, LDLDIRECT in the last 72 hours. Thyroid Function Tests: No results for input(s): TSH, T4TOTAL, FREET4, T3FREE, THYROIDAB in the last 72 hours. Anemia Panel: No results for input(s): VITAMINB12, FOLATE, FERRITIN, TIBC, IRON, RETICCTPCT in the last 72 hours. Urine analysis:    Component Value Date/Time   COLORURINE AMBER (A) 11/08/2017 0701   APPEARANCEUR HAZY (A) 11/08/2017 0701   LABSPEC 1.027 11/08/2017 0701   PHURINE 5.0 11/08/2017 0701   GLUCOSEU NEGATIVE  11/08/2017 0701   HGBUR SMALL (A) 11/08/2017 0701   BILIRUBINUR SMALL (A) 11/08/2017 0701   KETONESUR 5 (A) 11/08/2017 0701   PROTEINUR 100 (A) 11/08/2017 0701   NITRITE NEGATIVE 11/08/2017 0701   LEUKOCYTESUR NEGATIVE 11/08/2017 0701   Sepsis Labs: @LABRCNTIP (procalcitonin:4,lacticidven:4) )No results found for this or any previous visit (from the past 240 hour(s)).   Radiological Exams on Admission: Dg Chest 2 View  Result Date: 11/08/2017 CLINICAL DATA:  84 year old female with nonproductive cough for several weeks. Initial encounter. EXAM: CHEST - 2 VIEW COMPARISON:  06/03/2016 chest x-ray.  06/03/2016 CT abdomen pelvis. FINDINGS: Pulmonary vascular congestion superimposed upon chronic lung changes similar to prior exam. No obvious segmental consolidation although evaluation limited for detection of subtle infiltrate given the degree of chronic lung changes. No pneumothorax. Cardiomegaly.  Calcified mildly tortuous aorta. Remote T12 anterior wedge compression fracture with 50% loss of height anteriorly and 20% loss of height posteriorly. Further compression of L1 vertebral body with 75% loss of height anteriorly and 50% loss of height posteriorly. IMPRESSION: 1. Pulmonary vascular congestion superimposed upon chronic lung changes similar to prior exam. 2. No  obvious segmental consolidation although evaluation limited for detection of subtle infiltrate given the degree of chronic lung changes. 3. Cardiomegaly. 4.  Aortic Atherosclerosis (ICD10-I70.0). 5. Similar appearance T12 compression deformity. Further compression of L1 vertebral body as detailed above. Electronically Signed   By: Genia Del M.D.   On: 11/08/2017 09:37    EKG: Independently reviewed.  Atrial fibrillation at a rate of 143, no acute ischemic changes  Assessment/Plan Principal Problem:   Atrial fibrillation with RVR (HCC) Active Problems:   Essential hypertension   Hypokalemia   Hypomagnesemia    Atrial  fibrillation with RVR -She was started on Cardizem drip in the ED, by the time I see her she has already been transitioned over to oral Cardizem, heart rate is currently in the 80s to 90s on telemetry. -We will continue home dose of Cardizem CD 180 mg daily.  Son at bedside states that when her dose was increased to 240 mg she had significant fatigue and was found to be hypotensive.  Dose was decreased back to 180 mg in July. -Her CHADSVASC score is at least 3 on account for age, gender and history of hypertension.  She qualifies for anticoagulation. -Have had a prolonged discussion with patient and son at bedside regarding risk and benefit of anticoagulation.  Obviously benefit would be stroke prevention, stroke could be devastating to her.  Have also discussed risks which include mainly life-threatening bleeds.  At 84 years old she is independent in her ADLs, does not have a history of falls, uses a walker at home, son lives next door and assists with major tasks, no steps around the house, she does not leave the house much. -We have agreed to start anticoagulation.  Will start on Eliquis. -2D echo has been requested.  Electrolyte abnormalities -Mainly hypokalemia and hypomagnesemia. -These could have certainly lead to conduction abnormalities. -Replace and recheck in a.m.  Benign essential hypertension -Well-controlled, continue diltiazem 180 mg.   DVT prophylaxis: Eliquis Code Status: Full code Family Communication: Son and daughter-in-law at bedside updated on plan of care and all questions answered Disposition Plan: Would anticipate discharge home in 24 hours if heart rate remains stable Consults called: None Admission status: It is my clinical opinion that referral for OBSERVATION is reasonable and necessary in this patient based on the above information provided. The aforementioned taken together are felt to place the patient at high risk for further clinical deterioration. However it  is anticipated that the patient may be medically stable for discharge from the hospital within 24 to 48 hours.      Time Spent: 85 minutes  Estela Isaac Bliss MD [facility] Pager [phone]  If 7PM-7AM, please contact night-coverage [email or web address] Password [email or web address]  11/08/2017, 3:10 PM

## 2017-11-09 ENCOUNTER — Observation Stay (HOSPITAL_BASED_OUTPATIENT_CLINIC_OR_DEPARTMENT_OTHER): Payer: Medicare Other

## 2017-11-09 ENCOUNTER — Other Ambulatory Visit (HOSPITAL_COMMUNITY): Payer: Medicare Other

## 2017-11-09 DIAGNOSIS — I480 Paroxysmal atrial fibrillation: Secondary | ICD-10-CM | POA: Diagnosis present

## 2017-11-09 DIAGNOSIS — Z9049 Acquired absence of other specified parts of digestive tract: Secondary | ICD-10-CM | POA: Diagnosis not present

## 2017-11-09 DIAGNOSIS — I5033 Acute on chronic diastolic (congestive) heart failure: Secondary | ICD-10-CM | POA: Diagnosis present

## 2017-11-09 DIAGNOSIS — I110 Hypertensive heart disease with heart failure: Secondary | ICD-10-CM | POA: Diagnosis present

## 2017-11-09 DIAGNOSIS — I10 Essential (primary) hypertension: Secondary | ICD-10-CM

## 2017-11-09 DIAGNOSIS — I361 Nonrheumatic tricuspid (valve) insufficiency: Secondary | ICD-10-CM

## 2017-11-09 DIAGNOSIS — J9601 Acute respiratory failure with hypoxia: Secondary | ICD-10-CM | POA: Diagnosis present

## 2017-11-09 DIAGNOSIS — I4891 Unspecified atrial fibrillation: Secondary | ICD-10-CM | POA: Diagnosis not present

## 2017-11-09 DIAGNOSIS — Z880 Allergy status to penicillin: Secondary | ICD-10-CM | POA: Diagnosis not present

## 2017-11-09 DIAGNOSIS — E876 Hypokalemia: Secondary | ICD-10-CM

## 2017-11-09 DIAGNOSIS — H9193 Unspecified hearing loss, bilateral: Secondary | ICD-10-CM | POA: Diagnosis present

## 2017-11-09 DIAGNOSIS — Z79899 Other long term (current) drug therapy: Secondary | ICD-10-CM | POA: Diagnosis not present

## 2017-11-09 LAB — BASIC METABOLIC PANEL
Anion gap: 6 (ref 5–15)
BUN: 12 mg/dL (ref 8–23)
CHLORIDE: 107 mmol/L (ref 98–111)
CO2: 27 mmol/L (ref 22–32)
CREATININE: 0.61 mg/dL (ref 0.44–1.00)
Calcium: 8.2 mg/dL — ABNORMAL LOW (ref 8.9–10.3)
GFR calc non Af Amer: >60 mL/min (ref >60)
Glucose, Bld: 97 mg/dL (ref 70–99)
POTASSIUM: 4.9 mmol/L (ref 3.5–5.1)
SODIUM: 140 mmol/L (ref 135–145)

## 2017-11-09 LAB — ECHOCARDIOGRAM COMPLETE
HEIGHTINCHES: 64 in
Weight: 2543.23 oz

## 2017-11-09 LAB — CBC
HEMATOCRIT: 35.8 % — AB (ref 36.0–46.0)
HEMOGLOBIN: 11.1 g/dL — AB (ref 12.0–15.0)
MCH: 29.8 pg (ref 26.0–34.0)
MCHC: 31 g/dL (ref 30.0–36.0)
MCV: 96.2 fL (ref 78.0–100.0)
PLATELETS: 268 10*3/uL (ref 150–400)
RBC: 3.72 MIL/uL — AB (ref 3.87–5.11)
RDW: 16.1 % — ABNORMAL HIGH (ref 11.5–15.5)
WBC: 7.1 10*3/uL (ref 4.0–10.5)

## 2017-11-09 LAB — MAGNESIUM: Magnesium: 2 mg/dL (ref 1.7–2.4)

## 2017-11-09 MED ORDER — FUROSEMIDE 10 MG/ML IJ SOLN
20.0000 mg | Freq: Two times a day (BID) | INTRAMUSCULAR | Status: DC
  Administered 2017-11-09 – 2017-11-10 (×2): 20 mg via INTRAVENOUS
  Filled 2017-11-09 (×2): qty 2

## 2017-11-09 MED ORDER — BENZONATATE 100 MG PO CAPS
200.0000 mg | ORAL_CAPSULE | Freq: Three times a day (TID) | ORAL | Status: DC | PRN
  Administered 2017-11-09: 200 mg via ORAL
  Filled 2017-11-09: qty 2

## 2017-11-09 MED ORDER — GUAIFENESIN ER 600 MG PO TB12
1200.0000 mg | ORAL_TABLET | Freq: Two times a day (BID) | ORAL | Status: DC
  Administered 2017-11-09 (×2): 1200 mg via ORAL
  Filled 2017-11-09 (×3): qty 2

## 2017-11-09 NOTE — Progress Notes (Signed)
PROGRESS NOTE    Teresa JOSE  Stevens:[id number] DOB: January 11, 1925 DOA: 11/08/2017 PCP: Sharilyn Sites, MD     Brief Narrative:  84 year old woman admitted from home on 9/9 with complaints of a chronic cough and palpitations.  She is in remarkably good health in good shape despite her 89 years, her past medical history is only significant for hypertension for which she is on diltiazem.  In the ED she was found to have a heart rate of 147, was found to be hypokalemic and hypomagnesemic and admission was requested for further evaluation and management.   Assessment & Plan:   Principal Problem:   Atrial fibrillation with RVR (HCC) Active Problems:   Essential hypertension   Hypokalemia   Hypomagnesemia   Atrial fibrillation with rapid ventricular response -She was on a Cardizem drip transiently in the emergency department, was quickly transitioned back to her home dose of Cardizem, her heart rates today are in the 70s to 90 range. -No plans to further increase her Cardizem, son states that back in July her Cardizem was increased from 180 mg to 240 mg and dose needed to be taken down quickly due to extreme fatigue and bradycardia. -Has been newly started on Eliquis for stroke prevention.  Her chadsvasc score is at least 3 on account for her age, gender and history of hypertension.  Hypokalemia/hypomagnesemia -Potassium has been replaced and is 4.9 today. -Magnesium has been replaced and is 2.0 today.  Benign essential hypertension -Well-controlled, continue diltiazem.  Acute hypoxemic respiratory failure -Suspect some acute pulmonary edema due to rapid rates, unclear if she has diastolic heart failure. -2D echo has been requested. -Patient has significant crackles on lung auscultation today.  Will start Lasix 20 mg IV twice daily, strict I's and O's and daily weights.   DVT prophylaxis: Eliquis Code Status: Full code Family Communication: Son and daughter-in-law at bedside updated on  plan of care and all questions answered Disposition Plan: Home pending improvement in respiratory status and adequate diuresis.  Consultants:   None  Procedures:   2D echo  Antimicrobials:  Anti-infectives (From admission, onward)   None       Subjective: Lying in bed, is currently having a dry coughing spasm, denies chest pain or palpitations.  Objective: Vitals:   11/09/17 0530 11/09/17 0533 11/09/17 1019 11/09/17 1346  BP: (!) 149/71   115/74  Pulse: 90 90  90  Resp: 20   18  Temp: 98.9 F (37.2 C)     TempSrc:      SpO2: (!) 82% 90% 93% 96%  Weight:      Height:        Intake/Output Summary (Last 24 hours) at 11/09/2017 1445 Last data filed at 11/09/2017 0900 Gross per 24 hour  Intake 723 ml  Output 200 ml  Net 523 ml   Filed Weights   11/08/17 0654 11/08/17 1147  Weight: 82.6 kg 72.1 kg    Examination:  General exam: Alert, awake, oriented x 3 Respiratory system: Bibasilar crackles respiratory effort normal. Cardiovascular system: Regular rate, irregular rhythm no murmurs, rubs, gallops. Gastrointestinal system: Abdomen is nondistended, soft and nontender. No organomegaly or masses felt. Normal bowel sounds heard. Central nervous system: Alert and oriented. No focal neurological deficits. Extremities: No C/C/E, +pedal pulses Skin: No rashes, lesions or ulcers Psychiatry: Judgement and insight appear normal. Mood & affect appropriate.     Data Reviewed: I have personally reviewed following labs and imaging studies  CBC: Recent Labs  Lab 11/08/17  6269 11/09/17 0450  WBC 9.6 7.1  NEUTROABS 6.2  --   HGB 12.2 11.1*  HCT 38.1 35.8*  MCV 94.3 96.2  PLT 288 485   Basic Metabolic Panel: Recent Labs  Lab 11/08/17 0735 11/08/17 0853 11/09/17 0450  NA 136  --  140  K 2.7*  --  4.9  CL 101  --  107  CO2 25  --  27  GLUCOSE 110*  --  97  BUN 15  --  12  CREATININE 0.75  --  0.61  CALCIUM 8.1*  --  8.2*  MG  --  1.6* 2.0   GFR: Estimated  Creatinine Clearance: 44.6 mL/min (by C-G formula based on SCr of 0.61 mg/dL). Liver Function Tests: Recent Labs  Lab 11/08/17 0735  AST 41  ALT 8  ALKPHOS 63  BILITOT 0.8  PROT 5.7*  ALBUMIN 2.6*   No results for input(s): LIPASE, AMYLASE in the last 168 hours. No results for input(s): AMMONIA in the last 168 hours. Coagulation Profile: No results for input(s): INR, PROTIME in the last 168 hours. Cardiac Enzymes: Recent Labs  Lab 11/08/17 0735  TROPONINI <0.03   BNP (last 3 results) No results for input(s): PROBNP in the last 8760 hours. HbA1C: No results for input(s): HGBA1C in the last 72 hours. CBG: No results for input(s): GLUCAP in the last 168 hours. Lipid Profile: No results for input(s): CHOL, HDL, LDLCALC, TRIG, CHOLHDL, LDLDIRECT in the last 72 hours. Thyroid Function Tests: Recent Labs    11/08/17 1535  TSH 0.682   Anemia Panel: No results for input(s): VITAMINB12, FOLATE, FERRITIN, TIBC, IRON, RETICCTPCT in the last 72 hours. Urine analysis:    Component Value Date/Time   COLORURINE AMBER (A) 11/08/2017 0701   APPEARANCEUR HAZY (A) 11/08/2017 0701   LABSPEC 1.027 11/08/2017 0701   PHURINE 5.0 11/08/2017 0701   GLUCOSEU NEGATIVE 11/08/2017 0701   HGBUR SMALL (A) 11/08/2017 0701   BILIRUBINUR SMALL (A) 11/08/2017 0701   KETONESUR 5 (A) 11/08/2017 0701   PROTEINUR 100 (A) 11/08/2017 0701   NITRITE NEGATIVE 11/08/2017 0701   LEUKOCYTESUR NEGATIVE 11/08/2017 0701   Sepsis Labs: @LABRCNTIP (procalcitonin:4,lacticidven:4)  )No results found for this or any previous visit (from the past 240 hour(s)).       Radiology Studies: Dg Chest 2 View  Result Date: 11/08/2017 CLINICAL DATA:  84 year old female with nonproductive cough for several weeks. Initial encounter. EXAM: CHEST - 2 VIEW COMPARISON:  06/03/2016 chest x-ray.  06/03/2016 CT abdomen pelvis. FINDINGS: Pulmonary vascular congestion superimposed upon chronic lung changes similar to prior exam.  No obvious segmental consolidation although evaluation limited for detection of subtle infiltrate given the degree of chronic lung changes. No pneumothorax. Cardiomegaly.  Calcified mildly tortuous aorta. Remote T12 anterior wedge compression fracture with 50% loss of height anteriorly and 20% loss of height posteriorly. Further compression of L1 vertebral body with 75% loss of height anteriorly and 50% loss of height posteriorly. IMPRESSION: 1. Pulmonary vascular congestion superimposed upon chronic lung changes similar to prior exam. 2. No obvious segmental consolidation although evaluation limited for detection of subtle infiltrate given the degree of chronic lung changes. 3. Cardiomegaly. 4.  Aortic Atherosclerosis (ICD10-I70.0). 5. Similar appearance T12 compression deformity. Further compression of L1 vertebral body as detailed above. Electronically Signed   By: Genia Del M.D.   On: 11/08/2017 09:37        Scheduled Meds: . apixaban  5 mg Oral BID  . diltiazem  180 mg Oral  QHS  . furosemide  20 mg Intravenous Q12H  . guaiFENesin  1,200 mg Oral BID  . sodium chloride flush  3 mL Intravenous Q12H   Continuous Infusions: . sodium chloride       LOS: 1 day    Time spent: 35 minutes. Greater than 50% of this time was spent in direct contact with the patient and with patient's family, coordinating care and discussing relevant ongoing clinical issues, including plan to start IV Lasix to improve pulmonary edema, plan to obtain 2D echo to evaluate systolic and diastolic heart function.     Lelon Frohlich, MD [facility] Pager [phone]  If 7PM-7AM, please contact night-coverage [email or web address] Password [email or web address] 11/09/2017, 2:45 PM

## 2017-11-09 NOTE — Care Management CC44 (Signed)
Condition Code 44 Documentation Completed  Patient Details  Name: Teresa Stevens MRN: [id number] Date of Birth: 1924/11/03   Condition Code 44 given:  Yes Patient signature on Condition Code 44 notice:  Yes Documentation of 2 MD's agreement:  Yes Code 44 added to claim:  Yes    Samarth Ogle, Chauncey [place], RN 11/09/2017, 10:25 AM

## 2017-11-09 NOTE — Care Management Note (Signed)
Case Management Note  Patient Details  Name: Teresa Stevens MRN: [id number] Date of Birth: [birth year]  Subjective/Objective:        She is from home, where she has 24/7 aid services. She has PCP, transportation and no difficulty affording medications. She has no transportation needs. She will be new to Eliquis. Pharmacy has provided 30 day coupon.   Action/Plan:          DC home with previous care arrangements.    Expected Discharge Date:   11/09/2017               Expected Discharge Plan:  Home/Self Care  In-House Referral:     Discharge planning Services  CM Consult  Post Acute Care Choice:  NA Choice offered to:  NA  DME Arranged:    DME Agency:     HH Arranged:    HH Agency:     Status of Service:  Completed, signed off  If discussed at [employer] of Stay Meetings, dates discussed:    Additional Comments:  Venita Seng, Chauncey Reading, RN 11/09/2017, 11:57 AM

## 2017-11-09 NOTE — Progress Notes (Signed)
*  PRELIMINARY RESULTS* Echocardiogram 2D Echocardiogram has been performed.  Teresa Stevens 11/09/2017, 12:21 PM

## 2017-11-09 NOTE — Care Management Obs Status (Signed)
[place] NOTIFICATION   Patient Details  Name: DJENEBA BARSCH MRN: [id number] Date of Birth: [birth year]   Medicare Observation Status Notification Given:  Yes    Mikaella Escalona, [place]uncey [place], RN 11/09/2017, 10:25 AM

## 2017-11-10 LAB — CBC
HEMATOCRIT: 38.6 % (ref 36.0–46.0)
Hemoglobin: 12 g/dL (ref 12.0–15.0)
MCH: 29.9 pg (ref 26.0–34.0)
MCHC: 31.1 g/dL (ref 30.0–36.0)
MCV: 96 fL (ref 78.0–100.0)
Platelets: 277 10*3/uL (ref 150–400)
RBC: 4.02 MIL/uL (ref 3.87–5.11)
RDW: 16.2 % — ABNORMAL HIGH (ref 11.5–15.5)
WBC: 9 10*3/uL (ref 4.0–10.5)

## 2017-11-10 LAB — BASIC METABOLIC PANEL
ANION GAP: 7 (ref 5–15)
BUN: 11 mg/dL (ref 8–23)
CALCIUM: 8.5 mg/dL — AB (ref 8.9–10.3)
CO2: 29 mmol/L (ref 22–32)
Chloride: 98 mmol/L (ref 98–111)
Creatinine, Ser: 0.63 mg/dL (ref 0.44–1.00)
Glucose, Bld: 95 mg/dL (ref 70–99)
POTASSIUM: 4.5 mmol/L (ref 3.5–5.1)
SODIUM: 134 mmol/L — AB (ref 135–145)

## 2017-11-10 MED ORDER — FUROSEMIDE 20 MG PO TABS: 20.0000 mg | ORAL_TABLET | Freq: Every day | ORAL | 0 refills | Status: DC

## 2017-11-10 MED ORDER — GUAIFENESIN ER 600 MG PO TB12: 1200.0000 mg | ORAL_TABLET | Freq: Two times a day (BID) | ORAL | 0 refills | Status: AC

## 2017-11-10 MED ORDER — APIXABAN 5 MG PO TABS: 5.0000 mg | ORAL_TABLET | Freq: Two times a day (BID) | ORAL | 2 refills | Status: DC

## 2017-11-10 MED ORDER — DILTIAZEM HCL ER COATED BEADS 180 MG PO CP24: 180.0000 mg | ORAL_CAPSULE | Freq: Every day | ORAL | 2 refills | Status: AC

## 2017-11-10 NOTE — Discharge Summary (Signed)
Teresa Stevens, is a 84 y.o. female  DOB October 25, 1924  MRN [id number].  Admission date:  11/08/2017  Admitting Physician  Erline Hau, MD  Discharge Date:  11/10/2017   Primary MD  Sharilyn Sites, MD  Recommendations for primary care physician for things to follow:   1)You Refused Physical Therapy Evaluation--- Please have your Primary Physician as soon as possible to help set up home Physical Therapy 2)You are taking a blood Thinner so Avoid ibuprofen/Advil/Aleve/Motrin/Goody Powders/Naproxen/BC powders/Meloxicam/Diclofenac/Indomethacin and other Nonsteroidal anti-inflammatory medications as these will make you more likely to bleed and can cause stomach ulcers, can also cause Kidney problems.  3)You have Atrial Fibrillation  4)You have decided to leave the [place] Advice 5)You Need Repeat BMP and CBC Blood test with your Primary physician in 5 to 7 days 6)You are on Blood Thinner (Apixaban/Eliquis)-- so Avoid Falls    Admission Diagnosis  Hypokalemia [E87.6] Hypomagnesemia [E83.42] Atrial fibrillation with rapid ventricular response (HCC) [I48.91] Heart failure, unspecified HF chronicity, unspecified heart failure type ([place]) [I50.9]   Discharge Diagnosis  Hypokalemia [E87.6] Hypomagnesemia [E83.42] Atrial fibrillation with rapid ventricular response ([place]) [I48.91] Heart failure, unspecified HF chronicity, unspecified heart failure type ([place]) [I50.9]    Principal Problem:   Atrial fibrillation with RVR ([place]) Active Problems:   Essential hypertension   Hypokalemia   Hypomagnesemia      Past Medical History:  Diagnosis Date  . Arthritis   . Hypertension   . Pancreatitis     Past Surgical History:  Procedure Laterality Date  . CHOLECYSTECTOMY         HPI  from the history and physical done on the day of admission:    Chief Complaint: Palpitations  HPI:  Teresa Stevens is a 84 y.o. female who is remarkably healthy and in good shape, active despite her 60 years, with only past medical history significant for hypertension for which she is on diltiazem comes into the hospital today with complaints of palpitations.  For the past 3 weeks she has been having a dry cough and today noticed a feeling of her chest fluttering and came to the hospital for evaluation.  She was found to have an initial heart rate between 140 and 150.  She was also found to have electrolyte abnormalities including hypokalemia and hypomagnesemia with a K of 2.7 and a mag of 1.6.  Admission has been requested for further evaluation and management.     Hospital Course:  Principal Problem:   Atrial fibrillation with RVR (HCC) Active Problems:   Essential hypertension   Hypokalemia   Hypomagnesemia    Brief Narrative:  84 year old woman admitted from home on 11/08/17 with complaints of a chronic cough and palpitations.  She is in remarkably good health in good shape despite her 89 years, her past medical history is only significant for hypertension for which she is on diltiazem.  In the ED she was found to have a heart rate of 147, was found to be hypokalemic and hypomagnesemic and  admission was requested for further evaluation and management.    Plan:-  1)Atrial fibrillation with rapid ventricular response- -She was on a Cardizem drip transiently in the emergency department, was quickly transitioned back to her home dose of Cardizem, her heart rates today are in the 70s to 90 range. -No plans to further increase her Cardizem, son states that back in July her Cardizem was increased from 180 mg to 240 mg and dose needed to be taken down quickly due to extreme fatigue and bradycardia. -Has been newly started on Eliquis for stroke prevention.  Her chadsvasc score is at least 3 on account for her age, gender and history of hypertension.  CHA2DS2- VASc score   is = 3    Which is  equal to  = 3.2  % annual risk of stroke  This patients CHA2DS2-VASc Score and unadjusted Ischemic Stroke Rate (% per year) is equal to 3.2 % stroke rate/year from a score of 3  Okay to discharge on Cardizem CD 180 mg daily for rate control, along with Eliquis for stroke prophylaxis.  Risk versus benefit of anticoagulation with Eliquis , other options and alternatives discussed with patient, her son and daughter-in-law at bedside  2)Hypokalemia/hypomagnesemia -potassium magnesium normalized with replacement, suspect electrode abnormalities probably played a role in #1 above . 3)Benign essential hypertension-stable, continue Cardizem CD 180 mg daily . 4)Acute hypoxemic respiratory failure- -in the setting of A. fib with RVR,   resolved hypoxic respiratory failure, -2D echo with EF over 60% and grade 2 diastolic dysfunction, overall improved with Lasix, continue Lasix 20 mg daily post discharge   5)HFpEF----please see #4 above, overall diastolic CHF flareup was secondary to A. fib with RVR, echo as noted in #4 above, avoid excessive salt and fluid intake  6)Disposition/Social-prior to my arrival patient apparently became agitated this morning, requesting to go home prior to physical therapy evaluation, despite persuasion from patient's son, daughter-in-law and caregiver patient insisted on leaving right away prior to PT eval. physical therapist actually came to the room right away but patient refused to work with physical therapist, concern is for fall risk in the patient with anticoagulation, patient son states that he will talk to patient's PCP about setting up outpatient rehab for patient or in-home PT for patient thru home health services.  Patient actually got so agitated that she left AMA even while the physical therapist was trying to convince to allow him to evaluate her.  Patient's son states the patient gets this way at times and usually takes a few hours to come down.  Apparently this is not unusual  for patient.   DVT prophylaxis: Eliquis Code Status: Full code Family Communication: Son and daughter-in-law at bedside updated on plan of care and all questions answered Disposition Plan: Home AMA  Procedures:   2D echo   Discharge Condition: AMA  Follow UP--PCP to arrange physical therapy  Diet and Activity recommendation:  As advised  Discharge Instructions    Discharge Instructions    Call MD for:  difficulty breathing, headache or visual disturbances   Complete by:  As directed    Call MD for:  persistant dizziness or light-headedness   Complete by:  As directed    Call MD for:  persistant nausea and vomiting   Complete by:  As directed    Call MD for:  severe uncontrolled pain   Complete by:  As directed    Call MD for:  temperature >100.4   Complete by:  As  directed    Diet - low sodium heart healthy   Complete by:  As directed    Discharge instructions   Complete by:  As directed    1)You Refused Physical Therapy Evaluation--- Please have your Primary Physician as soon as possible to help set up home Physical Therapy 2)You are taking a blood Thinner so Avoid ibuprofen/Advil/Aleve/Motrin/Goody Powders/Naproxen/BC powders/Meloxicam/Diclofenac/Indomethacin and other Nonsteroidal anti-inflammatory medications as these will make you more likely to bleed and can cause stomach ulcers, can also cause Kidney problems.  3)You have Atrial Fibrillation  4)You have decided to leave the [place] Advice 5)You Need Repeat BMP and CBC Blood test with your Primary physician in 5 to 7 days 6)You are on Blood Thinner (Apixaban/Eliquis)-- so Avoid Falls   Increase activity slowly   Complete by:  As directed         Discharge Medications     Allergies as of 11/10/2017      Reactions   Penicillins Rash      Medication List    TAKE these medications   apixaban 5 MG Tabs tablet Commonly known as:  ELIQUIS Take 1 tablet (5 mg total) by mouth 2 (two) times  daily. For stroke prevention   diltiazem 180 MG 24 hr capsule Commonly known as:  CARDIZEM CD Take 1 capsule (180 mg total) by mouth at bedtime.   furosemide 20 MG tablet Commonly known as:  LASIX Take 1 tablet (20 mg total) by mouth daily.   guaiFENesin 600 MG 12 hr tablet Commonly known as:  MUCINEX Take 2 tablets (1,200 mg total) by mouth 2 (two) times daily.       Major procedures and Radiology Reports - PLEASE review detailed and final reports for all details, in brief -   Dg Chest 2 View  Result Date: 11/08/2017 CLINICAL DATA:  84 year old female with nonproductive cough for several weeks. Initial encounter. EXAM: CHEST - 2 VIEW COMPARISON:  06/03/2016 chest x-ray.  06/03/2016 CT abdomen pelvis. FINDINGS: Pulmonary vascular congestion superimposed upon chronic lung changes similar to prior exam. No obvious segmental consolidation although evaluation limited for detection of subtle infiltrate given the degree of chronic lung changes. No pneumothorax. Cardiomegaly.  Calcified mildly tortuous aorta. Remote T12 anterior wedge compression fracture with 50% loss of height anteriorly and 20% loss of height posteriorly. Further compression of L1 vertebral body with 75% loss of height anteriorly and 50% loss of height posteriorly. IMPRESSION: 1. Pulmonary vascular congestion superimposed upon chronic lung changes similar to prior exam. 2. No obvious segmental consolidation although evaluation limited for detection of subtle infiltrate given the degree of chronic lung changes. 3. Cardiomegaly. 4.  Aortic Atherosclerosis (ICD10-I70.0). 5. Similar appearance T12 compression deformity. Further compression of L1 vertebral body as detailed above. Electronically Signed   By: Genia Del M.D.   On: 11/08/2017 09:37    Micro Results   No results found for this or any previous visit (from the past 240 hour(s)).     Today   Subjective    Teresa Stevens today has no cp, no sob,     prior to my  arrival patient apparently became agitated this morning, requesting to go home prior to physical therapy evaluation, despite persuasion from patient's son, daughter-in-law and caregiver patient insisted on leaving right away prior to PT eval. physical therapist actually came to the room right away but patient refused to work with physical therapist, concern is for fall risk in the patient with anticoagulation, patient son states  that he will talk to patient's PCP about setting up outpatient rehab for patient or in-home PT for patient thru home health services.  Patient actually got so agitated that she left AMA even while the physical therapist was trying to convince to allow him to evaluate her.  Patient's son states the patient gets this way at times and usually takes a few hours to come down.  Apparently this is not unusual for patient.     Patient has been seen and examined prior to discharge   Objective   Blood pressure 140/81, pulse 87, temperature 98.4 F (36.9 C), temperature source Oral, resp. rate 18, height 5\' 4"  (1.626 m), weight 71.6 kg, SpO2 97 %.   Intake/Output Summary (Last 24 hours) at 11/10/2017 0924 Last data filed at 11/10/2017 0557 Gross per 24 hour  Intake 850.58 ml  Output 1800 ml  Net -949.42 ml    Exam Gen:- Awake Alert, agitated, not very cooperative HEENT:- [place].AT, No sclera icterus Neck-Supple Neck,No JVD,.  Lungs-  CTAB , fairly symmetrical air movement CV- S1, S2 normal, irregular Abd-  +ve B.Sounds, Abd Soft, No tenderness,    Extremity/Skin:- No  edema,   good pulses Psych-affect is agitated,, oriented x3 Neuro-generalized weakness without  new focal deficits, no tremors   Data Review   CBC w Diff:  Lab Results  Component Value Date   WBC 9.0 11/10/2017   HGB 12.0 11/10/2017   HCT 38.6 11/10/2017   PLT 277 11/10/2017   LYMPHOPCT 12 11/08/2017   MONOPCT 23 11/08/2017   EOSPCT 1 11/08/2017   BASOPCT 0 11/08/2017    CMP:  Lab Results    Component Value Date   NA 134 (L) 11/10/2017   K 4.5 11/10/2017   CL 98 11/10/2017   CO2 29 11/10/2017   BUN 11 11/10/2017   CREATININE 0.63 11/10/2017   PROT 5.7 (L) 11/08/2017   ALBUMIN 2.6 (L) 11/08/2017   BILITOT 0.8 11/08/2017   ALKPHOS 63 11/08/2017   AST 41 11/08/2017   ALT 8 11/08/2017  .   Total Discharge time is about 33 minutes  Roxan Hockey M.D on 11/10/2017 at 9:24 AM  Pager---[phone]  Go to [email or web address] - password [email or web address] for contact info  [facility] - Office  [phone]

## 2017-11-10 NOTE — Progress Notes (Signed)
Patient became extremely agitated this am and insisted to family and staff that she was leaving. MD in and attempted to explain to the patient about the need to see physical therapy as she had been in the bed and was now on a blood thinner. Patient refused to see therapy or stay any longer. MD informed patient and family about the possible consequences of leaving and that they would be responsible if anything happened, patient and family state understanding and sign against medical advice paperwork.

## 2017-11-15 DIAGNOSIS — E876 Hypokalemia: Secondary | ICD-10-CM | POA: Diagnosis not present

## 2017-11-15 DIAGNOSIS — I5030 Unspecified diastolic (congestive) heart failure: Secondary | ICD-10-CM | POA: Diagnosis not present

## 2017-11-15 DIAGNOSIS — Z6827 Body mass index (BMI) 27.0-27.9, adult: Secondary | ICD-10-CM | POA: Diagnosis not present

## 2017-11-15 DIAGNOSIS — I4891 Unspecified atrial fibrillation: Secondary | ICD-10-CM | POA: Diagnosis not present

## 2017-11-15 DIAGNOSIS — E663 Overweight: Secondary | ICD-10-CM | POA: Diagnosis not present

## 2017-12-21 ENCOUNTER — Encounter: Payer: Self-pay | Admitting: Cardiology

## 2017-12-21 NOTE — Progress Notes (Signed)
Cardiology Office Note  Date: 12/22/2017   ID: Teresa Stevens, DOB 1924-12-17, MRN [id number]  PCP: Sharilyn Sites, MD  Consulting Cardiologist: Rozann Lesches, MD   Chief Complaint  Patient presents with  . Atrial Fibrillation    History of Present Illness: Teresa Stevens is a 84 y.o. female referred for cardiology consultation by Dr. Hilma Favors with recent diagnosis of atrial fibrillation.  Record review finds hospitalization in September with rapid atrial fibrillation, presenting with palpitations and also electrolyte abnormalities.  With CHADSVASC score of 3 she was started on Eliquis, otherwise continued on calcium channel blocker therapy for heart rate control.  Electrolytes were repleted.  Echocardiogram was obtained revealing LVEF 60 to 65% with grade 2 diastolic dysfunction, severe left atrial enlargement with mild mitral regurgitation.  She is here today with her son and an Environmental consultant.  She does not report any palpitations or chest pain, no unusual shortness of breath.  We went over the cardiac findings of her hospital stay.  I personally reviewed her ECG today which shows sinus rhythm with PACs and PVC.  She reports compliance with her medications, no bleeding problems on Eliquis.  I went over her home heart rate and blood pressure checks, baseline heart rate is in the 70s to 80s.  Past Medical History:  Diagnosis Date  . Arthritis   . Atrial fibrillation [facility])    Documented September 2019  . Essential hypertension   . Pancreatitis     Past Surgical History:  Procedure Laterality Date  . CHOLECYSTECTOMY    . TONSILLECTOMY      Current Outpatient Medications  Medication Sig Dispense Refill  . apixaban (ELIQUIS) 5 MG TABS tablet Take 1 tablet (5 mg total) by mouth 2 (two) times daily. For stroke prevention 60 tablet 2  . diltiazem (DILTIAZEM CD) 180 MG 24 hr capsule Take 1 capsule (180 mg total) by mouth at bedtime. 30 capsule 2  . furosemide (LASIX) 20 MG tablet Take  20 mg by mouth as needed.    [facility] guaiFENesin (MUCINEX) 600 MG 12 hr tablet Take 2 tablets (1,200 mg total) by mouth 2 (two) times daily. 20 tablet 0   No current facility-administered medications for this visit.    Allergies:  Penicillins   Social History: The patient  reports that she has quit smoking. She has never used smokeless tobacco. She reports that she does not drink alcohol or use drugs.   Family History: The patient's Family history is unknown by patient.   ROS:  Please see the history of present illness. Otherwise, complete review of systems is positive for hearing loss.  All other systems are reviewed and negative.   Physical Exam: VS:  BP 124/64   Pulse 92   Ht 5' 5.5" (1.664 m)   Wt 160 lb (72.6 kg)   SpO2 97%   BMI 26.22 kg/m , BMI Body mass index is 26.22 kg/m.  Wt Readings from Last 3 Encounters:  12/22/17 160 lb (72.6 kg)  11/10/17 157 lb 13.6 oz (71.6 kg)  06/03/16 182 lb (82.6 kg)    General: Elderly woman in a wheelchair.  Patient appears comfortable at rest. HEENT: Conjunctiva and lids normal, oropharynx clear. Neck: Supple, no elevated JVP or carotid bruits, no thyromegaly. Lungs: Clear to auscultation, nonlabored breathing at rest. Cardiac: Regular rate and rhythm, no S3 or significant systolic murmur. Abdomen: Soft, nontender, bowel sounds present. Extremities: No pitting edema, distal pulses 2+. Skin: Warm and dry. Musculoskeletal: Mild kyphosis. Neuropsychiatric:  Alert and oriented x3, affect grossly appropriate.  ECG: I personally reviewed the tracing from 11/08/2017 which showed rapid atrial fibrillation with nonspecific ST changes.   Recent Labwork: 11/08/2017: ALT 8; AST 41; B Natriuretic Peptide 312.0; TSH 0.682 11/09/2017: Magnesium 2.0 11/10/2017: BUN 11; Creatinine, Ser 0.63; Hemoglobin 12.0; Platelets 277; Potassium 4.5; Sodium 134   Other Studies Reviewed Today:  Echocardiogram 11/09/2017: Study Conclusions  - Left ventricle: The cavity  size was normal. Wall thickness was   increased in a pattern of moderate LVH. Systolic function was   normal. The estimated ejection fraction was in the range of 60%   to 65%. Wall motion was normal; there were no regional wall   motion abnormalities. Features are consistent with a pseudonormal   left ventricular filling pattern, with concomitant abnormal   relaxation and increased filling pressure (grade 2 diastolic   dysfunction). Doppler parameters are consistent with high   ventricular filling pressure. - Aortic valve: Mildly calcified annulus. Trileaflet; mildly   thickened leaflets. Valve area (VTI): 1.85 cm^2. Valve area   (Vmax): 1.7 cm^2. - Mitral valve: Mildly calcified annulus. Mildly thickened leaflets   . There was mild regurgitation. - Left atrium: The atrium was severely dilated. - Pulmonary arteries: Systolic pressure was mildly increased. PA   peak pressure: 34 mm Hg (S). - Technically adequate study.  Assessment and Plan:  1.  Persistent atrial fibrillation, she is in sinus rhythm today by ECG and reports no palpitations.  Plan is to continue current doses of Eliquis and Cardizem CD.  Follow-up planned with CBC and BMET.  2.  Essential hypertension, blood pressure is well controlled today.  Current medicines were reviewed with the patient today.   Orders Placed This Encounter  Procedures  . EKG 12-Lead    Disposition: Follow-up in 4 months.  Signed, Satira Sark, MD, [facility] 12/22/2017 1:32 PM    [facility] [place], [place], [place] [address] Phone: [phone]; Fax: [phone]

## 2017-12-22 ENCOUNTER — Encounter: Payer: Self-pay | Admitting: Cardiology

## 2017-12-22 ENCOUNTER — Ambulatory Visit (INDEPENDENT_AMBULATORY_CARE_PROVIDER_SITE_OTHER): Payer: Medicare Other | Admitting: Cardiology

## 2017-12-22 VITALS — BP 124/64 | HR 92 | Ht 65.5 in | Wt 160.0 lb

## 2017-12-22 DIAGNOSIS — I10 Essential (primary) hypertension: Secondary | ICD-10-CM

## 2017-12-22 DIAGNOSIS — I4819 Other persistent atrial fibrillation: Secondary | ICD-10-CM | POA: Diagnosis not present

## 2017-12-22 NOTE — Patient Instructions (Signed)
Medication Instructions:   Your physician recommends that you continue on your current medications as directed. Please refer to the Current Medication list given to you today.  Labwork:  Your physician recommends that you return for lab work in: 4 months just before your next visit to check your BMET & CBC.  Testing/Procedures:  NONE  Follow-Up:  Your physician recommends that you schedule a follow-up appointment in: 4 months.   Any Other Special Instructions Will Be Listed Below (If Applicable).  If you need a refill on your cardiac medications before your next appointment, please call your pharmacy.

## 2018-02-08 ENCOUNTER — Telehealth: Payer: Self-pay | Admitting: Cardiology

## 2018-02-08 MED ORDER — APIXABAN 5 MG PO TABS: 5.0000 mg | ORAL_TABLET | Freq: Two times a day (BID) | ORAL | 11 refills | Status: DC

## 2018-02-08 NOTE — Telephone Encounter (Signed)
Needs [name] / tg

## 2018-02-08 NOTE — Telephone Encounter (Signed)
Refill complete 

## 2018-02-17 ENCOUNTER — Telehealth: Payer: Self-pay | Admitting: Cardiology

## 2018-02-17 NOTE — Telephone Encounter (Signed)
Spoke with son Jeneen Rinks) - wanted to make Dr. Domenic Polite aware that she had some swelling recently.  Stated they do have Lasix 20mg  as needed to use.  Have taken for about 2 days & plan to do a couple more.  Stated that her leg swelling is down & SOB is better since doing this.  Informed him about the 3lb weight gain / 24 hours or 5 lb / 1 week.  Advised him to let us know if the Lasix does not get things back in check for her.  Son verbalized understanding.

## 2018-02-17 NOTE — Telephone Encounter (Signed)
Jeneen Rinks (son) called in regards to speaking to someone about patient's medications.  Please call [phone].

## 2018-02-24 DIAGNOSIS — M1991 Primary osteoarthritis, unspecified site: Secondary | ICD-10-CM | POA: Diagnosis not present

## 2018-02-24 DIAGNOSIS — E663 Overweight: Secondary | ICD-10-CM | POA: Diagnosis not present

## 2018-02-24 DIAGNOSIS — M2550 Pain in unspecified joint: Secondary | ICD-10-CM | POA: Diagnosis not present

## 2018-02-24 DIAGNOSIS — Z6827 Body mass index (BMI) 27.0-27.9, adult: Secondary | ICD-10-CM | POA: Diagnosis not present

## 2018-04-27 ENCOUNTER — Other Ambulatory Visit (HOSPITAL_COMMUNITY)
Admission: RE | Admit: 2018-04-27 | Discharge: 2018-04-27 | Disposition: A | Discharge status: Home / Self Care | Payer: Medicare Other | Source: Ambulatory Visit | Attending: Cardiology | Admitting: Cardiology

## 2018-04-27 DIAGNOSIS — I4819 Other persistent atrial fibrillation: Secondary | ICD-10-CM | POA: Insufficient documentation

## 2018-04-27 LAB — CBC
HCT: 40.4 % (ref 36.0–46.0)
Hemoglobin: 12.3 g/dL (ref 12.0–15.0)
MCH: 29.1 pg (ref 26.0–34.0)
MCHC: 30.4 g/dL (ref 30.0–36.0)
MCV: 95.5 fL (ref 80.0–100.0)
NRBC: 0 % (ref 0.0–0.2)
PLATELETS: 308 10*3/uL (ref 150–400)
RBC: 4.23 MIL/uL (ref 3.87–5.11)
RDW: 15.9 % — ABNORMAL HIGH (ref 11.5–15.5)
WBC: 7.4 10*3/uL (ref 4.0–10.5)

## 2018-04-27 LAB — BASIC METABOLIC PANEL
ANION GAP: 9 (ref 5–15)
BUN: 16 mg/dL (ref 8–23)
CHLORIDE: 105 mmol/L (ref 98–111)
CO2: 25 mmol/L (ref 22–32)
Calcium: 9.1 mg/dL (ref 8.9–10.3)
Creatinine, Ser: 0.69 mg/dL (ref 0.44–1.00)
Glucose, Bld: 100 mg/dL — ABNORMAL HIGH (ref 70–99)
POTASSIUM: 3.5 mmol/L (ref 3.5–5.1)
Sodium: 139 mmol/L (ref 135–145)

## 2018-04-28 ENCOUNTER — Telehealth: Payer: Self-pay | Admitting: *Deleted

## 2018-04-28 NOTE — Telephone Encounter (Signed)
-----   Message from Satira Sark, MD sent at 04/27/2018  1:08 PM EST ----- Results reviewed.  Renal function, potassium, hemoglobin, and platelet count are normal.  Continue with current therapy. A copy of this test should be forwarded to Sharilyn Sites, MD.

## 2018-04-28 NOTE — Telephone Encounter (Signed)
Son Icey Tello informed and copy sent to PCP

## 2018-05-03 ENCOUNTER — Ambulatory Visit (INDEPENDENT_AMBULATORY_CARE_PROVIDER_SITE_OTHER): Payer: Medicare Other | Admitting: Cardiology

## 2018-05-03 ENCOUNTER — Encounter: Payer: Self-pay | Admitting: Cardiology

## 2018-05-03 VITALS — BP 128/77 | HR 91 | Ht 63.0 in | Wt 151.0 lb

## 2018-05-03 DIAGNOSIS — I4819 Other persistent atrial fibrillation: Secondary | ICD-10-CM | POA: Diagnosis not present

## 2018-05-03 DIAGNOSIS — I10 Essential (primary) hypertension: Secondary | ICD-10-CM | POA: Diagnosis not present

## 2018-05-03 NOTE — Patient Instructions (Addendum)
Medication Instructions:   Your physician recommends that you continue on your current medications as directed. Please refer to the Current Medication list given to you today.  Labwork:  Your physician recommends that you return for lab work in: 6 months just before your next visit to check your BMET & CBC. We will arrange this in about 4 months.  Testing/Procedures:  NONE  Follow-Up:  Your physician recommends that you schedule a follow-up appointment in: 6 months. You will receive a reminder letter in the mail in about 4 months reminding you to call and schedule your appointment. If you don't receive this letter, please contact our office.  Any Other Special Instructions Will Be Listed Below (If Applicable).  If you need a refill on your cardiac medications before your next appointment, please call your pharmacy.

## 2018-05-03 NOTE — Progress Notes (Signed)
Cardiology Office Note  Date: 05/03/2018   ID: Teresa Stevens, DOB 1924-07-29, MRN [id number]  PCP: Sharilyn Sites, MD  Primary Cardiologist: Rozann Lesches, MD   Chief Complaint  Patient presents with  . Atrial Fibrillation    History of Present Illness: Teresa Stevens is a 85 y.o. female seen in consultation back in October 2019.  She is here today with an Environmental consultant and also her son for a routine visit.  She does not report any interval sense of palpitations or chest pain.  I reviewed her medications which are outlined below.  She continues on Cardizem CD and Eliquis.  She denies any major bleeding episodes, no changes in stool.  Has had intermittent bruising on her skin.  Reviewed home blood pressure and heart rate checks per assistant.  Heart rate is averaging in the 70s.  I reviewed her recent lab work as detailed below.  Past Medical History:  Diagnosis Date  . Arthritis   . Atrial fibrillation [facility])    Documented September 2019  . Essential hypertension   . Pancreatitis     Past Surgical History:  Procedure Laterality Date  . CHOLECYSTECTOMY    . TONSILLECTOMY      Current Outpatient Medications  Medication Sig Dispense Refill  . apixaban (ELIQUIS) 5 MG TABS tablet Take 1 tablet (5 mg total) by mouth 2 (two) times daily. For stroke prevention 60 tablet 11  . diltiazem (DILTIAZEM CD) 180 MG 24 hr capsule Take 1 capsule (180 mg total) by mouth at bedtime. 30 capsule 2  . furosemide (LASIX) 20 MG tablet Take 20 mg by mouth as needed.    Marland Kitchen guaiFENesin (MUCINEX) 600 MG 12 hr tablet Take 2 tablets (1,200 mg total) by mouth 2 (two) times daily. 20 tablet 0   No current facility-administered medications for this visit.    Allergies:  Penicillins   Social History: The patient  reports that she quit smoking about 70 years ago. She has never used smokeless tobacco. She reports that she does not drink alcohol or use drugs.   ROS:  Please see the history of present  illness. Otherwise, complete review of systems is positive for hearing loss.  All other systems are reviewed and negative.   Physical Exam: VS:  BP 128/77 (BP Location: Left Arm)   Pulse 91   Ht 5\' 3"  (1.6 m)   Wt 151 lb (68.5 kg)   SpO2 96%   BMI 26.75 kg/m , BMI Body mass index is 26.75 kg/m.  Wt Readings from Last 3 Encounters:  05/03/18 151 lb (68.5 kg)  12/22/17 160 lb (72.6 kg)  11/10/17 157 lb 13.6 oz (71.6 kg)    General: Elderly woman in wheelchair. HEENT: Conjunctiva and lids normal, oropharynx clear. Neck: Supple, no elevated JVP or carotid bruits, no thyromegaly. Lungs: Clear to auscultation, nonlabored breathing at rest. Cardiac: Regular rate and rhythm, no S3, 2/6 systolic murmur. Abdomen: Soft, nontender, bowel sounds present. Extremities: No pitting edema, distal pulses 2+. Skin: Warm and dry. Musculoskeletal: No kyphosis. Neuropsychiatric: Alert and oriented x3, affect grossly appropriate.  ECG: I personally reviewed the tracing from 12/22/2017 which showed sinus rhythm with PACs and PVCs, nonspecific ST-T wave changes.  Recent Labwork: 11/08/2017: ALT 8; AST 41; B Natriuretic Peptide 312.0; TSH 0.682 11/09/2017: Magnesium 2.0 04/27/2018: BUN 16; Creatinine, Ser 0.69; Hemoglobin 12.3; Platelets 308; Potassium 3.5; Sodium 139   Other Studies Reviewed Today:  Echocardiogram 11/09/2017: Study Conclusions  - Left ventricle: The cavity  size was normal. Wall thickness was increased in a pattern of moderate LVH. Systolic function was normal. The estimated ejection fraction was in the range of 60% to 65%. Wall motion was normal; there were no regional wall motion abnormalities. Features are consistent with a pseudonormal left ventricular filling pattern, with concomitant abnormal relaxation and increased filling pressure (grade 2 diastolic dysfunction). Doppler parameters are consistent with high ventricular filling pressure. - Aortic valve:  Mildly calcified annulus. Trileaflet; mildly thickened leaflets. Valve area (VTI): 1.85 cm^2. Valve area (Vmax): 1.7 cm^2. - Mitral valve: Mildly calcified annulus. Mildly thickened leaflets . There was mild regurgitation. - Left atrium: The atrium was severely dilated. - Pulmonary arteries: Systolic pressure was mildly increased. PA peak pressure: 34 mm Hg (S). - Technically adequate study.  Assessment and Plan:  1.  Persistent atrial fibrillation, holding sinus rhythm at this point with regular heart rate today.  She does not report palpitations or chest pain.  Plan to continue current regimen including Cardizem CD and Eliquis.  Follow-up CBC and BMET for her next visit.  2.  Essential hypertension, blood pressure is well controlled today.  Current medicines were reviewed with the patient today.  Disposition: Follow-up in 6 months.  Signed, Satira Sark, MD, [facility] 05/03/2018 1:20 PM    [facility] [place], [place], [place] [address] Phone: [phone]; Fax: [phone]

## 2018-07-04 DIAGNOSIS — Z6825 Body mass index (BMI) 25.0-25.9, adult: Secondary | ICD-10-CM | POA: Diagnosis not present

## 2018-07-04 DIAGNOSIS — Z1389 Encounter for screening for other disorder: Secondary | ICD-10-CM | POA: Diagnosis not present

## 2018-07-04 DIAGNOSIS — M5410 Radiculopathy, site unspecified: Secondary | ICD-10-CM | POA: Diagnosis not present

## 2018-07-04 DIAGNOSIS — M5136 Other intervertebral disc degeneration, lumbar region: Secondary | ICD-10-CM | POA: Diagnosis not present

## 2018-07-04 DIAGNOSIS — E663 Overweight: Secondary | ICD-10-CM | POA: Diagnosis not present

## 2018-10-05 DIAGNOSIS — N390 Urinary tract infection, site not specified: Secondary | ICD-10-CM | POA: Diagnosis not present

## 2018-10-06 DIAGNOSIS — N390 Urinary tract infection, site not specified: Secondary | ICD-10-CM | POA: Diagnosis not present

## 2018-12-08 ENCOUNTER — Encounter: Payer: Self-pay | Admitting: Cardiology

## 2018-12-08 ENCOUNTER — Telehealth (INDEPENDENT_AMBULATORY_CARE_PROVIDER_SITE_OTHER): Payer: Medicare Other | Admitting: Cardiology

## 2018-12-08 VITALS — BP 127/81 | HR 85 | Ht 64.0 in | Wt 133.0 lb

## 2018-12-08 DIAGNOSIS — I10 Essential (primary) hypertension: Secondary | ICD-10-CM | POA: Diagnosis not present

## 2018-12-08 DIAGNOSIS — I4819 Other persistent atrial fibrillation: Secondary | ICD-10-CM

## 2018-12-08 NOTE — Progress Notes (Signed)
Virtual Visit via Telephone Note   This visit type was conducted due to national recommendations for restrictions regarding the COVID-19 Pandemic (e.g. social distancing) in an effort to limit this patient's exposure and mitigate transmission in our community.  Due to her co-morbid illnesses, this patient is at least at moderate risk for complications without adequate follow up.  This format is felt to be most appropriate for this patient at this time.  The patient did not have access to video technology/had technical difficulties with video requiring transitioning to audio format only (telephone).  All issues noted in this document were discussed and addressed.  No physical exam could be performed with this format.  Please refer to the patient's chart for her  consent to telehealth for [facility].  Date:  12/08/2018   ID:  Teresa Stevens, DOB [birth year], MRN [id number]  Patient Location: Home Provider Location: Office  PCP:  Sharilyn Sites, MD  Cardiologist:  Rozann Lesches, MD Electrophysiologist:  None   Evaluation Performed:  Follow-Up Visit  Chief Complaint:   Cardiac follow-up  History of Present Illness:    Teresa Stevens is a 85 y.o. female last seen in March.  We spoke by phone today.  I mainly communicated with her son and Environmental consultant.  Vital signs have been stable overall, no persistent tachycardia.  She has had intermittent back pain, fairly limited in terms of activity.  We discussed follow-up lab work on [employer].  Patient's son indicated that he wanted to hold off on taking her anywhere at this point, will reconsider over the next few months.  She has not had any obvious bleeding problems.  I reviewed her medication list as outlined below.  The patient does not have symptoms concerning for COVID-19 infection (fever, chills, cough, or new shortness of breath).    Past Medical History:  Diagnosis Date  . Arthritis   . Atrial fibrillation [facility])    Documented September  2019  . Essential hypertension   . Pancreatitis    Past Surgical History:  Procedure Laterality Date  . CHOLECYSTECTOMY    . TONSILLECTOMY       Current Meds  Medication Sig  . apixaban (ELIQUIS) 5 MG TABS tablet Take 1 tablet (5 mg total) by mouth 2 (two) times daily. For stroke prevention  . diltiazem (DILTIAZEM CD) 180 MG 24 hr capsule Take 1 capsule (180 mg total) by mouth at bedtime.  . furosemide (LASIX) 20 MG tablet Take 20 mg by mouth as needed.  Marland Kitchen guaiFENesin (MUCINEX) 600 MG 12 hr tablet Take 2 tablets (1,200 mg total) by mouth 2 (two) times daily.     Allergies:   Penicillins   Social History   Tobacco Use  . Smoking status: Former Smoker    Quit date: 05/02/1948    Years since quitting: 70.6  . Smokeless tobacco: Never Used  Substance Use Topics  . Alcohol use: No  . Drug use: No     Family Hx: The patient's She was adopted. Family history is unknown by patient.  ROS:   Please see the history of present illness. All other systems reviewed and are negative.   Prior CV studies:   The following studies were reviewed today:  Echocardiogram 11/09/2017: Study Conclusions  - Left ventricle: The cavity size was normal. Wall thickness was increased in a pattern of moderate LVH. Systolic function was normal. The estimated ejection fraction was in the range of 60% to 65%. Wall motion was normal; there were  no regional wall motion abnormalities. Features are consistent with a pseudonormal left ventricular filling pattern, with concomitant abnormal relaxation and increased filling pressure (grade 2 diastolic dysfunction). Doppler parameters are consistent with high ventricular filling pressure. - Aortic valve: Mildly calcified annulus. Trileaflet; mildly thickened leaflets. Valve area (VTI): 1.85 cm^2. Valve area (Vmax): 1.7 cm^2. - Mitral valve: Mildly calcified annulus. Mildly thickened leaflets . There was mild regurgitation. - Left  atrium: The atrium was severely dilated. - Pulmonary arteries: Systolic pressure was mildly increased. PA peak pressure: 34 mm Hg (S). - Technically adequate study.  Labs/Other Tests and Data Reviewed:    EKG:  An ECG dated 12/22/2017 was personally reviewed today and demonstrated:  Sinus rhythm with PACs and PVCs, nonspecific ST-T changes.  Recent Labs: 04/27/2018: BUN 16; Creatinine, Ser 0.69; Hemoglobin 12.3; Platelets 308; Potassium 3.5; Sodium 139    Wt Readings from Last 3 Encounters:  12/08/18 133 lb (60.3 kg)  05/03/18 151 lb (68.5 kg)  12/22/17 160 lb (72.6 kg)     Objective:    Vital Signs:  BP 127/81   Pulse 85   Ht 5\' 4"  (1.626 m)   Wt 133 lb (60.3 kg)   SpO2 96%   BMI 22.83 kg/m    Patient did not voice any concerns.  I spoke with her son and Environmental consultant.  ASSESSMENT & PLAN:    1.  Persistent atrial fibrillation by history.  No obvious persistent tachycardia to suggest recurring arrhythmia.  Her heart rate is in the 80s on Cardizem CD and she continues on Eliquis.  We have discussed follow-up lab work around the time of her next visit.  No reported bleeding problems.  2.  Essential hypertension, systolic is in the [id number] today.  Keep follow-up with PCP.  COVID-19 Education: The signs and symptoms of COVID-19 were discussed with the patient and how to seek care for testing (follow up with PCP or arrange E-visit). The importance of social distancing was discussed today.  Time:   Today, I have spent 6 minutes with the patient with telehealth technology discussing the above problems.     Medication Adjustments/Labs and Tests Ordered: Current medicines are reviewed at length with the patient today.  Concerns regarding medicines are outlined above.   Tests Ordered: No orders of the defined types were placed in this encounter.   Medication Changes: No orders of the defined types were placed in this encounter.   Follow Up:  Virtual Visit 3 months.  Signed,  Rozann Lesches, MD  12/08/2018 10:38 AM    [place]

## 2018-12-08 NOTE — Patient Instructions (Addendum)
Medication Instructions:   Your physician recommends that you continue on your current medications as directed. Please refer to the Current Medication list given to you today.  Labwork:  NONE  Testing/Procedures:  NONE  Follow-Up:  Your physician recommends that you schedule a follow-up appointment in: 3 months.  Any Other Special Instructions Will Be Listed Below (If Applicable).  If you need a refill on your cardiac medications before your next appointment, please call your pharmacy. 

## 2019-01-04 DIAGNOSIS — Z681 Body mass index (BMI) 19 or less, adult: Secondary | ICD-10-CM | POA: Diagnosis not present

## 2019-01-04 DIAGNOSIS — Z1389 Encounter for screening for other disorder: Secondary | ICD-10-CM | POA: Diagnosis not present

## 2019-01-04 DIAGNOSIS — Z0000 Encounter for general adult medical examination without abnormal findings: Secondary | ICD-10-CM | POA: Diagnosis not present

## 2019-01-20 IMAGING — DX DG CHEST 2V
2 series · 2 of 2 positions shown · non-contrast
Comparison: 06/03/2016 chest x-ray.  06/03/2016 CT abdomen pelvis.

CLINICAL DATA: [AGE] female with nonproductive cough for
several weeks. Initial encounter.

EXAM:
CHEST - 2 VIEW

[chest pa]
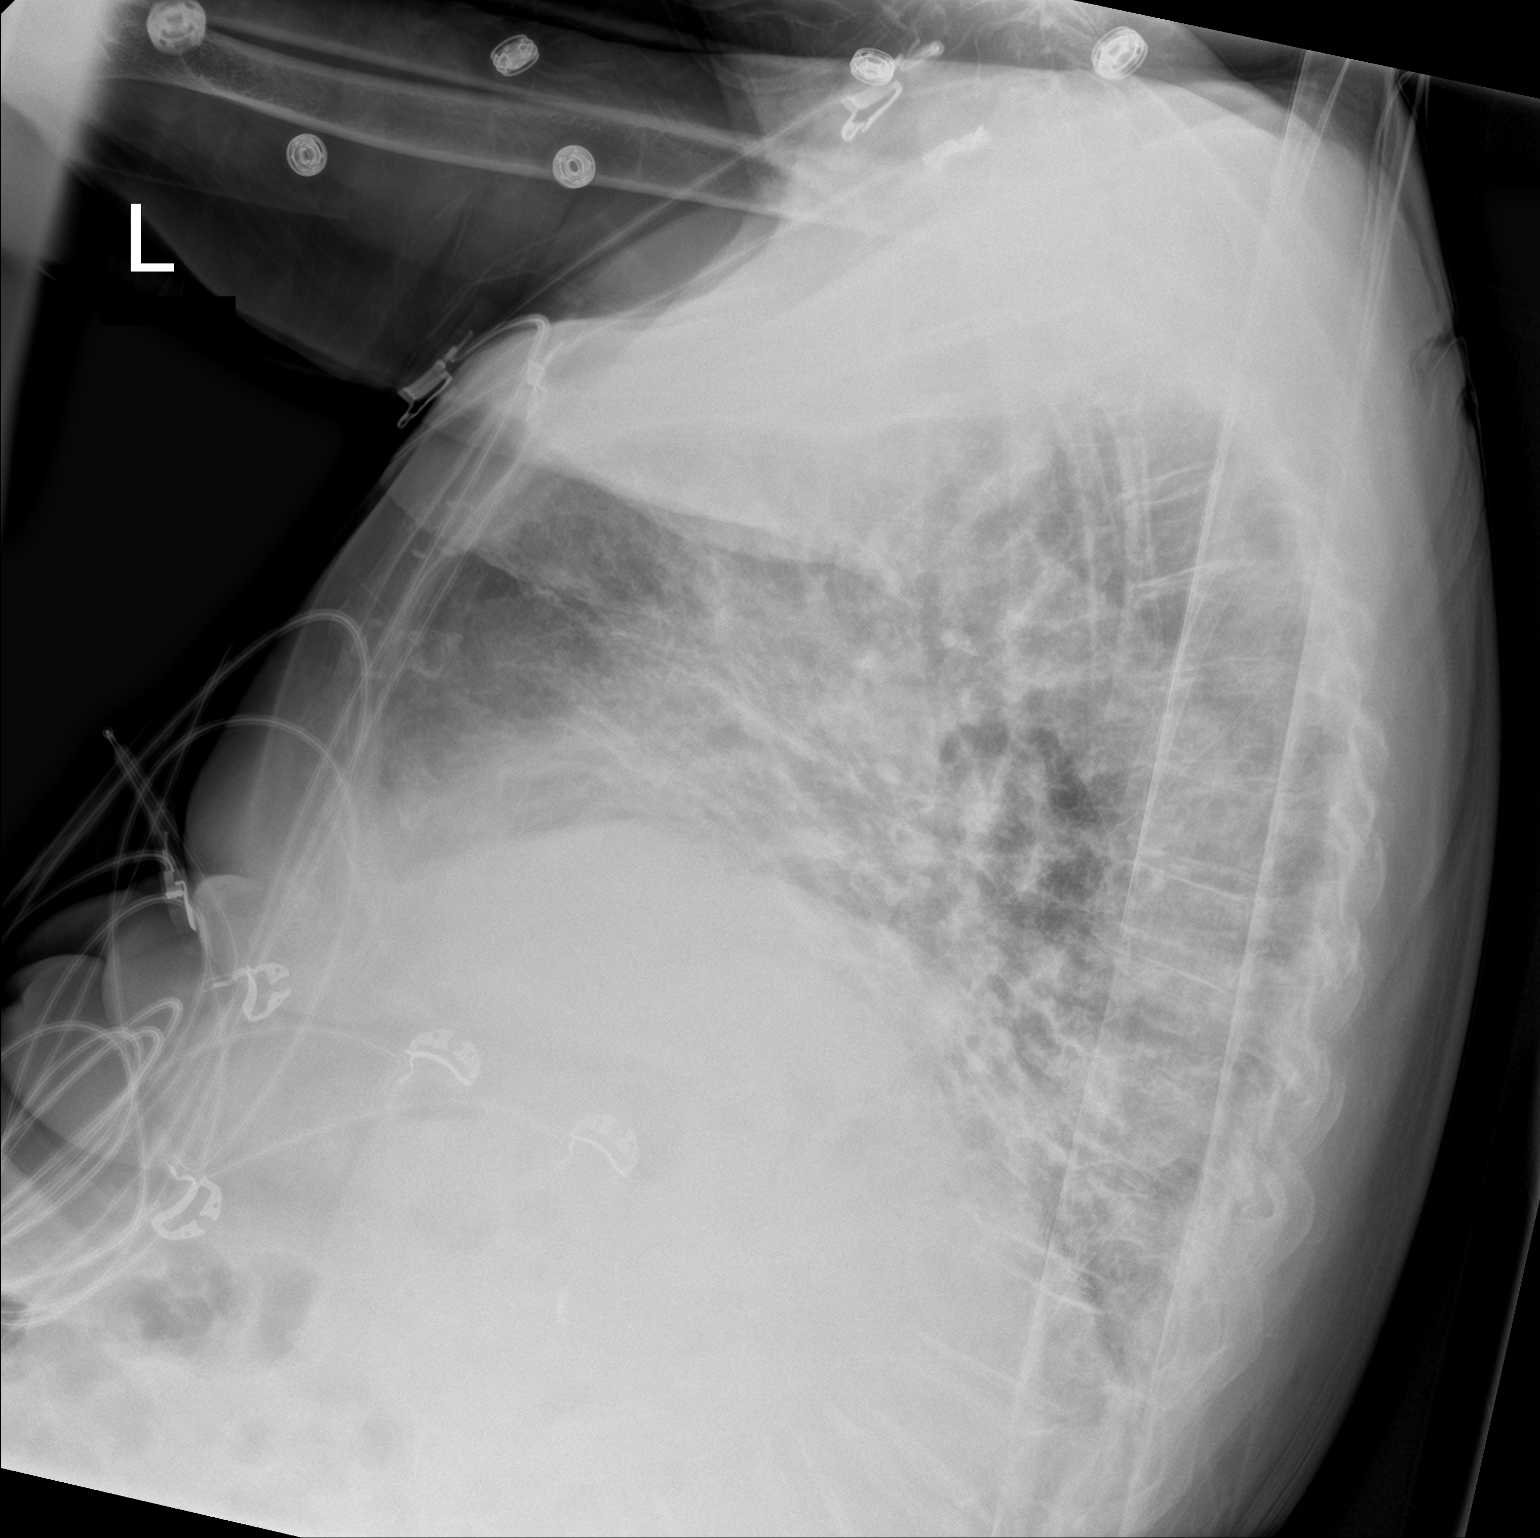

[chest ap]
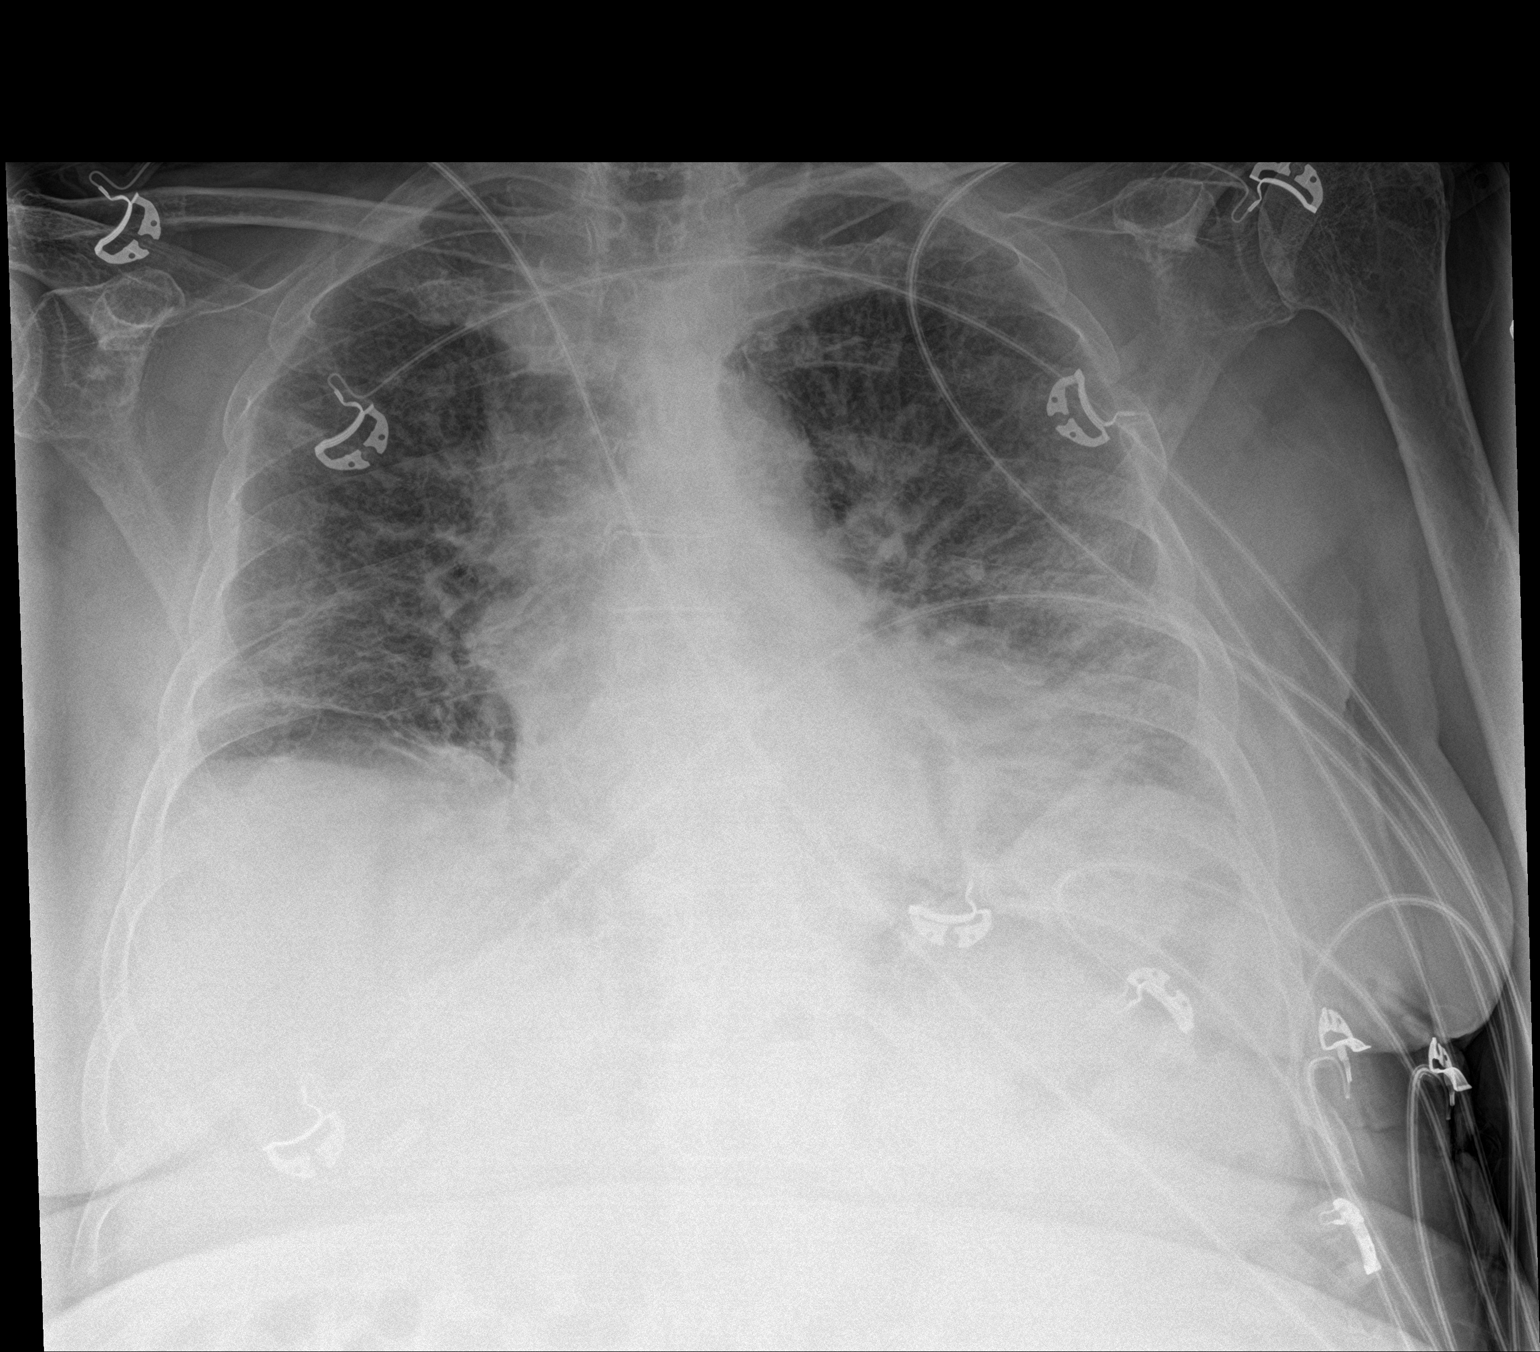

[2 of 2 positions shown; findings below may reference images not displayed]

FINDINGS: Pulmonary vascular congestion superimposed upon chronic lung changes
similar to prior exam. No obvious segmental consolidation although
evaluation limited for detection of subtle infiltrate given the
degree of chronic lung changes. No pneumothorax.

Cardiomegaly.  Calcified mildly tortuous aorta.

Remote T12 anterior wedge compression fracture with 50% loss of
height anteriorly and 20% loss of height posteriorly. Further
compression of L1 vertebral body with 75% loss of height anteriorly
and 50% loss of height posteriorly.
IMPRESSION: 1. Pulmonary vascular congestion superimposed upon chronic lung
changes similar to prior exam.
2. No obvious segmental consolidation although evaluation limited
for detection of subtle infiltrate given the degree of chronic lung
changes.
3. Cardiomegaly.
4.  Aortic Atherosclerosis ([id number]).
5. Similar appearance T12 compression deformity. Further compression
of L1 vertebral body as detailed above.

## 2019-02-16 ENCOUNTER — Other Ambulatory Visit: Payer: Self-pay | Admitting: Cardiology

## 2019-03-16 ENCOUNTER — Telehealth: Payer: Self-pay | Admitting: Cardiology

## 2019-03-16 NOTE — Telephone Encounter (Signed)
Virtual Visit Pre-Appointment Phone Call  "(Name), I am calling you today to discuss your upcoming appointment. We are currently trying to limit exposure to the virus that causes COVID-19 by seeing patients at home rather than in the office."  "What is the BEST phone number to call the day of the visit?" -   [phone]  1. "Do you have or have access to (through a family member/friend) a smartphone with video capability that we can use for your visit?" a. If yes - list this number in appt notes as "cell" (if different from BEST phone #) and list the appointment type as a VIDEO visit in appointment notes b. If no - list the appointment type as a PHONE visit in appointment notes  2. Confirm consent - "In the setting of the current Covid19 crisis, you are scheduled for a (phone or video) visit with your provider on (date) at (time).  Just as we do with many in-office visits, in order for you to participate in this visit, we must obtain consent.  If you'd like, I can send this to your mychart (if signed up) or email for you to review.  Otherwise, I can obtain your verbal consent now.  All virtual visits are billed to your insurance company just like a normal visit would be.  By agreeing to a virtual visit, we'd like you to understand that the technology does not allow for your provider to perform an examination, and thus may limit your provider's ability to fully assess your condition. If your provider identifies any concerns that need to be evaluated in person, we will make arrangements to do so.  Finally, though the technology is pretty good, we cannot assure that it will always work on either your or our end, and in the setting of a video visit, we may have to convert it to a phone-only visit.  In either situation, we cannot ensure that we have a secure connection.  Are you willing to proceed?" STAFF: Did the patient verbally acknowledge consent to telehealth visit? Document YES/NO here: YES    3. Advise patient to be prepared - "Two hours prior to your appointment, go ahead and check your blood pressure, pulse, oxygen saturation, and your weight (if you have the equipment to check those) and write them all down. When your visit starts, your provider will ask you for this information. If you have an Apple Watch or Kardia device, please plan to have heart rate information ready on the day of your appointment. Please have a pen and paper handy nearby the day of the visit as well."  4. Give patient instructions for MyChart download to smartphone OR Doximity/Doxy.me as below if video visit (depending on what platform provider is using)  5. Inform patient they will receive a phone call 15 minutes prior to their appointment time (may be from unknown caller ID) so they should be prepared to answer    TELEPHONE CALL NOTE  VELISHA WIEBER has been deemed a candidate for a follow-up tele-health visit to limit community exposure during the Covid-19 pandemic. I spoke with the patient via phone to ensure availability of phone/video source, confirm preferred email & phone number, and discuss instructions and expectations.  I reminded Teresa Stevens to be prepared with any vital sign and/or heart rhythm information that could potentially be obtained via home monitoring, at the time of her visit. I reminded Teresa Stevens to expect a phone call prior to her visit.  Chanda Busing 03/16/2019 11:26 AM   INSTRUCTIONS FOR DOWNLOADING THE MYCHART APP TO SMARTPHONE  - The patient must first make sure to have activated MyChart and know their login information - If Apple, go to [employer] and type in MyChart in the search bar and download the app. If Android, ask patient to go to [employer] and type in [place] in the search bar and download the app. The app is free but as with any other app downloads, their phone may require them to verify saved payment information or Apple/Android password.  - The  patient will need to then log into the app with their MyChart username and password, and select [facility] as their healthcare provider to link the account. When it is time for your visit, go to the MyChart app, find appointments, and click Begin Video Visit. Be sure to Select Allow for your device to access the Microphone and Camera for your visit. You will then be connected, and your provider will be with you shortly.  **If they have any issues connecting, or need assistance please contact MyChart service desk [phone])**  **If using a computer, in order to ensure the best quality for their visit they will need to use either of the following Internet Browsers: [employer], or Google Chrome**  IF USING DOXIMITY or DOXY.ME - The patient will receive a link just prior to their visit by text.     FULL LENGTH CONSENT FOR TELE-HEALTH VISIT   I hereby voluntarily request, consent and authorize [facility] and its employed or contracted physicians, physician assistants, nurse practitioners or other licensed health care professionals (the Practitioner), to provide me with telemedicine health care services (the "Services") as deemed necessary by the treating Practitioner. I acknowledge and consent to receive the Services by the Practitioner via telemedicine. I understand that the telemedicine visit will involve communicating with the Practitioner through live audiovisual communication technology and the disclosure of certain medical information by electronic transmission. I acknowledge that I have been given the opportunity to request an in-person assessment or other available alternative prior to the telemedicine visit and am voluntarily participating in the telemedicine visit.  I understand that I have the right to withhold or withdraw my consent to the use of telemedicine in the course of my care at any time, without affecting my right to future care or treatment, and that the  Practitioner or I may terminate the telemedicine visit at any time. I understand that I have the right to inspect all information obtained and/or recorded in the course of the telemedicine visit and may receive copies of available information for a reasonable fee.  I understand that some of the potential risks of receiving the Services via telemedicine include:  Marland Kitchen Delay or interruption in medical evaluation due to technological equipment failure or disruption; . Information transmitted may not be sufficient (e.g. poor resolution of images) to allow for appropriate medical decision making by the Practitioner; and/or  . In rare instances, security protocols could fail, causing a breach of personal health information.  Furthermore, I acknowledge that it is my responsibility to provide information about my medical history, conditions and care that is complete and accurate to the best of my ability. I acknowledge that Practitioner's advice, recommendations, and/or decision may be based on factors not within their control, such as incomplete or inaccurate data provided by me or distortions of diagnostic images or specimens that may result from electronic transmissions. I understand that the  practice of medicine is not an Chief Strategy Officer and that Practitioner makes no warranties or guarantees regarding treatment outcomes. I acknowledge that I will receive a copy of this consent concurrently upon execution via email to the email address I last provided but may also request a printed copy by calling the office of [facility].    I understand that my insurance will be billed for this visit.   I have read or had this consent read to me. . I understand the contents of this consent, which adequately explains the benefits and risks of the Services being provided via telemedicine.  . I have been provided ample opportunity to ask questions regarding this consent and the Services and have had my questions answered to my  satisfaction. . I give my informed consent for the services to be provided through the use of telemedicine in my medical care  By participating in this telemedicine visit I agree to the above.

## 2019-03-20 NOTE — Progress Notes (Signed)
Virtual Visit via Telephone Note   This visit type was conducted due to national recommendations for restrictions regarding the COVID-19 Pandemic (e.g. social distancing) in an effort to limit this patient's exposure and mitigate transmission in our community.  Due to her co-morbid illnesses, this patient is at least at moderate risk for complications without adequate follow up.  This format is felt to be most appropriate for this patient at this time.  The patient did not have access to video technology/had technical difficulties with video requiring transitioning to audio format only (telephone).  All issues noted in this document were discussed and addressed.  No physical exam could be performed with this format.  Please refer to the patient's chart for her  consent to telehealth for [facility].   Date:  03/21/2019   ID:  Teresa Stevens, DOB 05-26-1924, MRN [id number]  Patient Location: Home Provider Location: Office  PCP:  Sharilyn Sites, MD  Cardiologist:  Rozann Lesches, MD Electrophysiologist:  None   Evaluation Performed:  Follow-Up Visit  Chief Complaint:  Cardiac follow-up  History of Present Illness:    Teresa Stevens is a 86 y.o. female last assessed via telehealth encounter in October 2020.  We spoke by phone today.  She has not gotten out of the house much at all during the pandemic.  She does not describe any palpitations or chest pain.  Fairly sedentary.  We discussed follow-up lab work on [employer].  We will try to get a CBC and BMET arranged through the [facility] lab.  She does not describe any obvious changes in stools, does have some bruising.  I reviewed the remainder of her medications which are outlined below.  The patient does not have symptoms concerning for COVID-19 infection (fever, chills, cough, or new shortness of breath).    Past Medical History:  Diagnosis Date  . Arthritis   . Atrial fibrillation [facility])    Documented September 2019  . Essential  hypertension   . Pancreatitis    Past Surgical History:  Procedure Laterality Date  . CHOLECYSTECTOMY    . TONSILLECTOMY       Current Meds  Medication Sig  . diltiazem (DILTIAZEM CD) 180 MG 24 hr capsule Take 1 capsule (180 mg total) by mouth at bedtime.  Marland Kitchen ELIQUIS 5 MG TABS tablet TAKE 1 TABLET BY MOUTH 2 TIMES DAILY. FOR STROKE PREVENTION  . furosemide (LASIX) 20 MG tablet Take 20 mg by mouth as needed.  Marland Kitchen guaiFENesin (MUCINEX) 600 MG 12 hr tablet Take 2 tablets (1,200 mg total) by mouth 2 (two) times daily. (Patient taking differently: Take 1,200 mg by mouth 2 (two) times daily as needed. )     Allergies:   Penicillins   Social History   Tobacco Use  . Smoking status: Former Smoker    Quit date: 05/02/1948    Years since quitting: 70.9  . Smokeless tobacco: Never Used  Substance Use Topics  . Alcohol use: No  . Drug use: No     Family Hx: The patient's She was adopted. Family history is unknown by patient.  ROS:   Please see the history of present illness.    All other systems reviewed and are negative.   Prior CV studies:   The following studies were reviewed today:  Echocardiogram 11/09/2017: Study Conclusions  - Left ventricle: The cavity size was normal. Wall thickness was increased in a pattern of moderate LVH. Systolic function was normal. The estimated ejection fraction was in  the range of 60% to 65%. Wall motion was normal; there were no regional wall motion abnormalities. Features are consistent with a pseudonormal left ventricular filling pattern, with concomitant abnormal relaxation and increased filling pressure (grade 2 diastolic dysfunction). Doppler parameters are consistent with high ventricular filling pressure. - Aortic valve: Mildly calcified annulus. Trileaflet; mildly thickened leaflets. Valve area (VTI): 1.85 cm^2. Valve area (Vmax): 1.7 cm^2. - Mitral valve: Mildly calcified annulus. Mildly thickened leaflets .  There was mild regurgitation. - Left atrium: The atrium was severely dilated. - Pulmonary arteries: Systolic pressure was mildly increased. PA peak pressure: 34 mm Hg (S). - Technically adequate study.  Labs/Other Tests and Data Reviewed:    EKG:  An ECG dated 12/22/2017 was personally reviewed today and demonstrated:  Sinus rhythm with PACs and PVCs, nonspecific ST-T changes.  Recent Labs: 04/27/2018: BUN 16; Creatinine, Ser 0.69; Hemoglobin 12.3; Platelets 308; Potassium 3.5; Sodium 139    Wt Readings from Last 3 Encounters:  03/21/19 144 lb (65.3 kg)  12/08/18 133 lb (60.3 kg)  05/03/18 151 lb (68.5 kg)     Objective:    Vital Signs:  BP (!) 114/58   Pulse 79   Ht 5\' 3"  (1.6 m)   Wt 144 lb (65.3 kg)   BMI 25.51 kg/m    Patient spoke in brief sentences, not obviously short of breath. No audible wheezing or coughing.  ASSESSMENT & PLAN:    1.  Persistent atrial fibrillation.  She does not describe any obvious palpitations.  Plan to continue Cardizem CD along with Eliquis.  Check CBC and BMET.  2.  Essential hypertension.  Blood pressure is normal today.  Continue with observation and follow-up with PCP.  COVID-19 Education: The signs and symptoms of COVID-19 were discussed with the patient and how to seek care for testing (follow up with PCP or arrange E-visit).  The importance of social distancing was discussed today.  Time:   Today, I have spent 6 minutes with the patient with telehealth technology discussing the above problems.     Medication Adjustments/Labs and Tests Ordered: Current medicines are reviewed at length with the patient today.  Concerns regarding medicines are outlined above.   Tests Ordered: Orders Placed This Encounter  Procedures  . Basic metabolic panel  . CBC    Medication Changes: No orders of the defined types were placed in this encounter.   Follow Up:  Either In Person or Virtual 6 months.  Signed, Rozann Lesches, MD    03/21/2019 11:41 AM    [place]

## 2019-03-21 ENCOUNTER — Encounter: Payer: Self-pay | Admitting: Cardiology

## 2019-03-21 ENCOUNTER — Telehealth (INDEPENDENT_AMBULATORY_CARE_PROVIDER_SITE_OTHER): Payer: Medicare Other | Admitting: Cardiology

## 2019-03-21 VITALS — BP 114/58 | HR 79 | Ht 63.0 in | Wt 144.0 lb

## 2019-03-21 DIAGNOSIS — I10 Essential (primary) hypertension: Secondary | ICD-10-CM

## 2019-03-21 DIAGNOSIS — I4819 Other persistent atrial fibrillation: Secondary | ICD-10-CM | POA: Diagnosis not present

## 2019-03-21 DIAGNOSIS — Z79899 Other long term (current) drug therapy: Secondary | ICD-10-CM

## 2019-03-21 NOTE — Patient Instructions (Addendum)
Medication Instructions:   Your physician recommends that you continue on your current medications as directed. Please refer to the Current Medication list given to you today.  Labwork:  Your physician recommends that you return for non-fasting lab work as soon as possible to check your BMET and CBC. Please arrange this to be done at [facility] if possible.  Testing/Procedures:  NONE  Follow-Up:  Your physician recommends that you schedule a follow-up appointment in: 6 months (virtual). You will receive a reminder letter in the mail in about 4 months reminding you to call and schedule your appointment. If you don't receive this letter, please contact our office.  Any Other Special Instructions Will Be Listed Below (If Applicable).  If you need a refill on your cardiac medications before your next appointment, please call your pharmacy.

## 2019-05-24 ENCOUNTER — Telehealth: Payer: Self-pay | Admitting: *Deleted

## 2019-05-24 NOTE — Telephone Encounter (Signed)
Pt says she has been to weak and unable to go get labs done - pt has telehealth appt for pcp tomorrow to try to set up home health to come for physical therapy and to see if they can draw labs from pt home - pt will update [place]

## 2019-05-24 NOTE — Telephone Encounter (Signed)
Noted  

## 2019-05-26 ENCOUNTER — Other Ambulatory Visit: Payer: Self-pay | Admitting: *Deleted

## 2019-05-26 DIAGNOSIS — I10 Essential (primary) hypertension: Secondary | ICD-10-CM | POA: Diagnosis not present

## 2019-05-26 DIAGNOSIS — E538 Deficiency of other specified B group vitamins: Secondary | ICD-10-CM | POA: Diagnosis not present

## 2019-05-26 DIAGNOSIS — Z79899 Other long term (current) drug therapy: Secondary | ICD-10-CM

## 2019-05-26 DIAGNOSIS — R7309 Other abnormal glucose: Secondary | ICD-10-CM | POA: Diagnosis not present

## 2019-05-26 DIAGNOSIS — Z13220 Encounter for screening for lipoid disorders: Secondary | ICD-10-CM | POA: Diagnosis not present

## 2019-05-26 DIAGNOSIS — Z1389 Encounter for screening for other disorder: Secondary | ICD-10-CM | POA: Diagnosis not present

## 2019-05-26 DIAGNOSIS — I4819 Other persistent atrial fibrillation: Secondary | ICD-10-CM

## 2019-05-26 DIAGNOSIS — D649 Anemia, unspecified: Secondary | ICD-10-CM | POA: Diagnosis not present

## 2019-05-26 DIAGNOSIS — M1991 Primary osteoarthritis, unspecified site: Secondary | ICD-10-CM | POA: Diagnosis not present

## 2019-05-26 DIAGNOSIS — E559 Vitamin D deficiency, unspecified: Secondary | ICD-10-CM | POA: Diagnosis not present

## 2019-05-26 DIAGNOSIS — I5030 Unspecified diastolic (congestive) heart failure: Secondary | ICD-10-CM | POA: Diagnosis not present

## 2019-05-26 DIAGNOSIS — Z6824 Body mass index (BMI) 24.0-24.9, adult: Secondary | ICD-10-CM | POA: Diagnosis not present

## 2019-06-12 DIAGNOSIS — Z6824 Body mass index (BMI) 24.0-24.9, adult: Secondary | ICD-10-CM | POA: Diagnosis not present

## 2019-06-12 DIAGNOSIS — E538 Deficiency of other specified B group vitamins: Secondary | ICD-10-CM | POA: Diagnosis not present

## 2019-06-12 DIAGNOSIS — D509 Iron deficiency anemia, unspecified: Secondary | ICD-10-CM | POA: Diagnosis not present

## 2019-06-14 DIAGNOSIS — H17823 Peripheral opacity of cornea, bilateral: Secondary | ICD-10-CM | POA: Diagnosis not present

## 2019-06-14 DIAGNOSIS — H5052 Exophoria: Secondary | ICD-10-CM | POA: Diagnosis not present

## 2019-06-14 DIAGNOSIS — Z961 Presence of intraocular lens: Secondary | ICD-10-CM | POA: Diagnosis not present

## 2019-06-14 DIAGNOSIS — H532 Diplopia: Secondary | ICD-10-CM | POA: Diagnosis not present

## 2019-06-14 DIAGNOSIS — H5022 Vertical strabismus, left eye: Secondary | ICD-10-CM | POA: Diagnosis not present

## 2019-06-14 DIAGNOSIS — H04123 Dry eye syndrome of bilateral lacrimal glands: Secondary | ICD-10-CM | POA: Diagnosis not present

## 2019-07-03 ENCOUNTER — Telehealth: Payer: Self-pay

## 2019-07-03 NOTE — Telephone Encounter (Signed)
Son notified that it is recommended that heartcare patient get the vaccine.

## 2019-07-03 NOTE — Telephone Encounter (Signed)
Pt's son is asking if Pt should have COVID vaccine.   Please call [phone]   Thanks renee

## 2019-07-10 DIAGNOSIS — I4891 Unspecified atrial fibrillation: Secondary | ICD-10-CM | POA: Diagnosis not present

## 2019-07-10 DIAGNOSIS — Z79899 Other long term (current) drug therapy: Secondary | ICD-10-CM | POA: Diagnosis not present

## 2019-07-10 DIAGNOSIS — Z20822 Contact with and (suspected) exposure to covid-19: Secondary | ICD-10-CM | POA: Diagnosis not present

## 2019-07-10 DIAGNOSIS — D649 Anemia, unspecified: Secondary | ICD-10-CM | POA: Diagnosis not present

## 2019-07-10 DIAGNOSIS — K5730 Diverticulosis of large intestine without perforation or abscess without bleeding: Secondary | ICD-10-CM | POA: Diagnosis not present

## 2019-07-10 DIAGNOSIS — N6320 Unspecified lump in the left breast, unspecified quadrant: Secondary | ICD-10-CM | POA: Diagnosis not present

## 2019-07-10 DIAGNOSIS — E876 Hypokalemia: Secondary | ICD-10-CM | POA: Diagnosis not present

## 2019-07-10 DIAGNOSIS — M1990 Unspecified osteoarthritis, unspecified site: Secondary | ICD-10-CM | POA: Diagnosis not present

## 2019-07-10 DIAGNOSIS — R0602 Shortness of breath: Secondary | ICD-10-CM | POA: Diagnosis not present

## 2019-07-10 DIAGNOSIS — R110 Nausea: Secondary | ICD-10-CM | POA: Diagnosis not present

## 2019-07-10 DIAGNOSIS — R262 Difficulty in walking, not elsewhere classified: Secondary | ICD-10-CM | POA: Diagnosis not present

## 2019-07-10 DIAGNOSIS — R918 Other nonspecific abnormal finding of lung field: Secondary | ICD-10-CM | POA: Diagnosis not present

## 2019-07-10 DIAGNOSIS — Z87891 Personal history of nicotine dependence: Secondary | ICD-10-CM | POA: Diagnosis not present

## 2019-07-10 DIAGNOSIS — R32 Unspecified urinary incontinence: Secondary | ICD-10-CM | POA: Diagnosis not present

## 2019-07-10 DIAGNOSIS — R079 Chest pain, unspecified: Secondary | ICD-10-CM | POA: Diagnosis not present

## 2019-07-10 DIAGNOSIS — I509 Heart failure, unspecified: Secondary | ICD-10-CM | POA: Diagnosis not present

## 2019-07-10 DIAGNOSIS — R011 Cardiac murmur, unspecified: Secondary | ICD-10-CM | POA: Diagnosis not present

## 2019-07-10 DIAGNOSIS — R0902 Hypoxemia: Secondary | ICD-10-CM | POA: Diagnosis not present

## 2019-07-10 DIAGNOSIS — Z7901 Long term (current) use of anticoagulants: Secondary | ICD-10-CM | POA: Diagnosis not present

## 2019-07-10 DIAGNOSIS — I482 Chronic atrial fibrillation, unspecified: Secondary | ICD-10-CM | POA: Diagnosis not present

## 2019-07-10 DIAGNOSIS — Z8673 Personal history of transient ischemic attack (TIA), and cerebral infarction without residual deficits: Secondary | ICD-10-CM | POA: Diagnosis not present

## 2019-07-10 DIAGNOSIS — Z880 Allergy status to penicillin: Secondary | ICD-10-CM | POA: Diagnosis not present

## 2019-07-11 DIAGNOSIS — Z7901 Long term (current) use of anticoagulants: Secondary | ICD-10-CM | POA: Diagnosis not present

## 2019-07-11 DIAGNOSIS — R079 Chest pain, unspecified: Secondary | ICD-10-CM | POA: Diagnosis not present

## 2019-07-11 DIAGNOSIS — R918 Other nonspecific abnormal finding of lung field: Secondary | ICD-10-CM | POA: Diagnosis not present

## 2019-07-11 DIAGNOSIS — I4891 Unspecified atrial fibrillation: Secondary | ICD-10-CM | POA: Diagnosis not present

## 2019-07-12 DIAGNOSIS — I509 Heart failure, unspecified: Secondary | ICD-10-CM | POA: Diagnosis not present

## 2019-07-12 DIAGNOSIS — I4891 Unspecified atrial fibrillation: Secondary | ICD-10-CM | POA: Diagnosis not present

## 2019-07-12 DIAGNOSIS — Z7901 Long term (current) use of anticoagulants: Secondary | ICD-10-CM | POA: Diagnosis not present

## 2019-07-12 DIAGNOSIS — R918 Other nonspecific abnormal finding of lung field: Secondary | ICD-10-CM | POA: Diagnosis not present

## 2019-07-12 DIAGNOSIS — E876 Hypokalemia: Secondary | ICD-10-CM | POA: Diagnosis not present

## 2019-07-17 DIAGNOSIS — Z8673 Personal history of transient ischemic attack (TIA), and cerebral infarction without residual deficits: Secondary | ICD-10-CM | POA: Diagnosis not present

## 2019-07-17 DIAGNOSIS — Z681 Body mass index (BMI) 19 or less, adult: Secondary | ICD-10-CM | POA: Diagnosis not present

## 2019-07-17 DIAGNOSIS — C799 Secondary malignant neoplasm of unspecified site: Secondary | ICD-10-CM | POA: Diagnosis not present

## 2019-07-17 DIAGNOSIS — I4891 Unspecified atrial fibrillation: Secondary | ICD-10-CM | POA: Diagnosis not present

## 2019-07-18 DIAGNOSIS — I519 Heart disease, unspecified: Secondary | ICD-10-CM | POA: Diagnosis not present

## 2019-07-18 DIAGNOSIS — I5030 Unspecified diastolic (congestive) heart failure: Secondary | ICD-10-CM | POA: Diagnosis not present

## 2019-07-18 DIAGNOSIS — C801 Malignant (primary) neoplasm, unspecified: Secondary | ICD-10-CM | POA: Diagnosis not present

## 2019-07-18 DIAGNOSIS — I4891 Unspecified atrial fibrillation: Secondary | ICD-10-CM | POA: Diagnosis not present

## 2019-07-18 DIAGNOSIS — Z8673 Personal history of transient ischemic attack (TIA), and cerebral infarction without residual deficits: Secondary | ICD-10-CM | POA: Diagnosis not present

## 2019-07-20 DIAGNOSIS — Z8673 Personal history of transient ischemic attack (TIA), and cerebral infarction without residual deficits: Secondary | ICD-10-CM | POA: Diagnosis not present

## 2019-07-20 DIAGNOSIS — I519 Heart disease, unspecified: Secondary | ICD-10-CM | POA: Diagnosis not present

## 2019-07-20 DIAGNOSIS — I5030 Unspecified diastolic (congestive) heart failure: Secondary | ICD-10-CM | POA: Diagnosis not present

## 2019-07-20 DIAGNOSIS — C801 Malignant (primary) neoplasm, unspecified: Secondary | ICD-10-CM | POA: Diagnosis not present

## 2019-07-20 DIAGNOSIS — I4891 Unspecified atrial fibrillation: Secondary | ICD-10-CM | POA: Diagnosis not present

## 2019-07-26 DIAGNOSIS — I4891 Unspecified atrial fibrillation: Secondary | ICD-10-CM | POA: Diagnosis not present

## 2019-07-26 DIAGNOSIS — I519 Heart disease, unspecified: Secondary | ICD-10-CM | POA: Diagnosis not present

## 2019-07-26 DIAGNOSIS — Z8673 Personal history of transient ischemic attack (TIA), and cerebral infarction without residual deficits: Secondary | ICD-10-CM | POA: Diagnosis not present

## 2019-07-26 DIAGNOSIS — I5030 Unspecified diastolic (congestive) heart failure: Secondary | ICD-10-CM | POA: Diagnosis not present

## 2019-07-26 DIAGNOSIS — C801 Malignant (primary) neoplasm, unspecified: Secondary | ICD-10-CM | POA: Diagnosis not present

## 2019-07-27 DIAGNOSIS — I5030 Unspecified diastolic (congestive) heart failure: Secondary | ICD-10-CM | POA: Diagnosis not present

## 2019-07-27 DIAGNOSIS — I4891 Unspecified atrial fibrillation: Secondary | ICD-10-CM | POA: Diagnosis not present

## 2019-07-27 DIAGNOSIS — C801 Malignant (primary) neoplasm, unspecified: Secondary | ICD-10-CM | POA: Diagnosis not present

## 2019-07-27 DIAGNOSIS — Z8673 Personal history of transient ischemic attack (TIA), and cerebral infarction without residual deficits: Secondary | ICD-10-CM | POA: Diagnosis not present

## 2019-07-27 DIAGNOSIS — I519 Heart disease, unspecified: Secondary | ICD-10-CM | POA: Diagnosis not present

## 2019-08-01 DIAGNOSIS — I4891 Unspecified atrial fibrillation: Secondary | ICD-10-CM | POA: Diagnosis not present

## 2019-08-01 DIAGNOSIS — I519 Heart disease, unspecified: Secondary | ICD-10-CM | POA: Diagnosis not present

## 2019-08-01 DIAGNOSIS — C801 Malignant (primary) neoplasm, unspecified: Secondary | ICD-10-CM | POA: Diagnosis not present

## 2019-08-01 DIAGNOSIS — Z8673 Personal history of transient ischemic attack (TIA), and cerebral infarction without residual deficits: Secondary | ICD-10-CM | POA: Diagnosis not present

## 2019-08-01 DIAGNOSIS — I5030 Unspecified diastolic (congestive) heart failure: Secondary | ICD-10-CM | POA: Diagnosis not present

## 2019-08-03 DIAGNOSIS — I5030 Unspecified diastolic (congestive) heart failure: Secondary | ICD-10-CM | POA: Diagnosis not present

## 2019-08-03 DIAGNOSIS — Z8673 Personal history of transient ischemic attack (TIA), and cerebral infarction without residual deficits: Secondary | ICD-10-CM | POA: Diagnosis not present

## 2019-08-03 DIAGNOSIS — I4891 Unspecified atrial fibrillation: Secondary | ICD-10-CM | POA: Diagnosis not present

## 2019-08-03 DIAGNOSIS — I519 Heart disease, unspecified: Secondary | ICD-10-CM | POA: Diagnosis not present

## 2019-08-03 DIAGNOSIS — C801 Malignant (primary) neoplasm, unspecified: Secondary | ICD-10-CM | POA: Diagnosis not present

## 2019-08-09 DIAGNOSIS — C801 Malignant (primary) neoplasm, unspecified: Secondary | ICD-10-CM | POA: Diagnosis not present

## 2019-08-09 DIAGNOSIS — Z8673 Personal history of transient ischemic attack (TIA), and cerebral infarction without residual deficits: Secondary | ICD-10-CM | POA: Diagnosis not present

## 2019-08-09 DIAGNOSIS — I4891 Unspecified atrial fibrillation: Secondary | ICD-10-CM | POA: Diagnosis not present

## 2019-08-09 DIAGNOSIS — I519 Heart disease, unspecified: Secondary | ICD-10-CM | POA: Diagnosis not present

## 2019-08-09 DIAGNOSIS — I5030 Unspecified diastolic (congestive) heart failure: Secondary | ICD-10-CM | POA: Diagnosis not present

## 2019-08-15 DIAGNOSIS — I4891 Unspecified atrial fibrillation: Secondary | ICD-10-CM | POA: Diagnosis not present

## 2019-08-15 DIAGNOSIS — I519 Heart disease, unspecified: Secondary | ICD-10-CM | POA: Diagnosis not present

## 2019-08-15 DIAGNOSIS — C801 Malignant (primary) neoplasm, unspecified: Secondary | ICD-10-CM | POA: Diagnosis not present

## 2019-08-15 DIAGNOSIS — Z8673 Personal history of transient ischemic attack (TIA), and cerebral infarction without residual deficits: Secondary | ICD-10-CM | POA: Diagnosis not present

## 2019-08-15 DIAGNOSIS — I5030 Unspecified diastolic (congestive) heart failure: Secondary | ICD-10-CM | POA: Diagnosis not present

## 2019-08-23 DIAGNOSIS — I4891 Unspecified atrial fibrillation: Secondary | ICD-10-CM | POA: Diagnosis not present

## 2019-08-23 DIAGNOSIS — I5030 Unspecified diastolic (congestive) heart failure: Secondary | ICD-10-CM | POA: Diagnosis not present

## 2019-08-23 DIAGNOSIS — I519 Heart disease, unspecified: Secondary | ICD-10-CM | POA: Diagnosis not present

## 2019-08-23 DIAGNOSIS — Z8673 Personal history of transient ischemic attack (TIA), and cerebral infarction without residual deficits: Secondary | ICD-10-CM | POA: Diagnosis not present

## 2019-08-23 DIAGNOSIS — C801 Malignant (primary) neoplasm, unspecified: Secondary | ICD-10-CM | POA: Diagnosis not present

## 2019-08-24 DIAGNOSIS — C801 Malignant (primary) neoplasm, unspecified: Secondary | ICD-10-CM | POA: Diagnosis not present

## 2019-08-24 DIAGNOSIS — I5030 Unspecified diastolic (congestive) heart failure: Secondary | ICD-10-CM | POA: Diagnosis not present

## 2019-08-24 DIAGNOSIS — I4891 Unspecified atrial fibrillation: Secondary | ICD-10-CM | POA: Diagnosis not present

## 2019-08-24 DIAGNOSIS — I519 Heart disease, unspecified: Secondary | ICD-10-CM | POA: Diagnosis not present

## 2019-08-24 DIAGNOSIS — Z8673 Personal history of transient ischemic attack (TIA), and cerebral infarction without residual deficits: Secondary | ICD-10-CM | POA: Diagnosis not present

## 2019-08-31 DIAGNOSIS — I4891 Unspecified atrial fibrillation: Secondary | ICD-10-CM | POA: Diagnosis not present

## 2019-08-31 DIAGNOSIS — C801 Malignant (primary) neoplasm, unspecified: Secondary | ICD-10-CM | POA: Diagnosis not present

## 2019-08-31 DIAGNOSIS — I519 Heart disease, unspecified: Secondary | ICD-10-CM | POA: Diagnosis not present

## 2019-08-31 DIAGNOSIS — I5030 Unspecified diastolic (congestive) heart failure: Secondary | ICD-10-CM | POA: Diagnosis not present

## 2019-08-31 DIAGNOSIS — Z8673 Personal history of transient ischemic attack (TIA), and cerebral infarction without residual deficits: Secondary | ICD-10-CM | POA: Diagnosis not present

## 2019-09-07 DIAGNOSIS — I519 Heart disease, unspecified: Secondary | ICD-10-CM | POA: Diagnosis not present

## 2019-09-07 DIAGNOSIS — I4891 Unspecified atrial fibrillation: Secondary | ICD-10-CM | POA: Diagnosis not present

## 2019-09-07 DIAGNOSIS — C801 Malignant (primary) neoplasm, unspecified: Secondary | ICD-10-CM | POA: Diagnosis not present

## 2019-09-07 DIAGNOSIS — I5030 Unspecified diastolic (congestive) heart failure: Secondary | ICD-10-CM | POA: Diagnosis not present

## 2019-09-07 DIAGNOSIS — Z8673 Personal history of transient ischemic attack (TIA), and cerebral infarction without residual deficits: Secondary | ICD-10-CM | POA: Diagnosis not present

## 2019-09-13 ENCOUNTER — Telehealth: Payer: Self-pay | Admitting: Cardiology

## 2019-09-13 DIAGNOSIS — I5030 Unspecified diastolic (congestive) heart failure: Secondary | ICD-10-CM | POA: Diagnosis not present

## 2019-09-13 DIAGNOSIS — C801 Malignant (primary) neoplasm, unspecified: Secondary | ICD-10-CM | POA: Diagnosis not present

## 2019-09-13 DIAGNOSIS — Z8673 Personal history of transient ischemic attack (TIA), and cerebral infarction without residual deficits: Secondary | ICD-10-CM | POA: Diagnosis not present

## 2019-09-13 DIAGNOSIS — I519 Heart disease, unspecified: Secondary | ICD-10-CM | POA: Diagnosis not present

## 2019-09-13 DIAGNOSIS — I4891 Unspecified atrial fibrillation: Secondary | ICD-10-CM | POA: Diagnosis not present

## 2019-09-13 MED ORDER — APIXABAN 5 MG PO TABS: ORAL_TABLET | ORAL | 11 refills | Status: DC

## 2019-09-13 NOTE — Telephone Encounter (Signed)
Son informed and verbalized understanding

## 2019-09-13 NOTE — Telephone Encounter (Signed)
Son reports bleeding hemorrhoids off and on for the past 2 weeks. Son says it clears up and then returns. Reports seeing bright red blood (jelly-like consistency) in brief today about the size of a tablespoon. Denies chest pain. Reports fatigue and sob all the time that is unchanged. Reports skin color is normal. Reports patient is eating and drinking okay. Son is asking if he can reduce eliquis 5 mg to daily. Advised that it would be reasonable to decrease eliquis to 1/2 tablet twice daily. Advised that she may need to hold eliquis long enough for hemorrhoids to heal. Advised that message would be sent to covering provider for further advice. Verbalized understanding of plan.

## 2019-09-13 NOTE — Telephone Encounter (Signed)
Pt is having some rectal bleeding and she's taking ELIQUIS 5 MG TABS tablet [[id number]]  2 times a day and they're wanting to cut her back to 1 time a day.   Please call the pt's son [phone]

## 2019-09-13 NOTE — Telephone Encounter (Signed)
    Given her persistent hemorrhoids, I would recommend they hold Eliquis altogether for 48-72 hours then resume. Her only indication for reduced dosing at this time is age as weight is > 60 kg and creatinine was less than 1.5 by recent labs. Taking a lower dose makes this less effective. Please verify her most recent weight and request a copy of her most recent labs (unclear if these were obtained by Home Health earlier this year by review of notes).   Signed, Erma Heritage, PA-C 09/13/2019, 4:35 PM Pager: [phone]

## 2019-09-19 ENCOUNTER — Telehealth (INDEPENDENT_AMBULATORY_CARE_PROVIDER_SITE_OTHER): Admitting: Cardiology

## 2019-09-19 ENCOUNTER — Encounter: Payer: Self-pay | Admitting: Cardiology

## 2019-09-19 VITALS — BP 110/54 | HR 92 | Ht 63.0 in | Wt 142.2 lb

## 2019-09-19 DIAGNOSIS — I10 Essential (primary) hypertension: Secondary | ICD-10-CM

## 2019-09-19 DIAGNOSIS — I4821 Permanent atrial fibrillation: Secondary | ICD-10-CM | POA: Diagnosis not present

## 2019-09-19 MED ORDER — APIXABAN 2.5 MG PO TABS: 2.5000 mg | ORAL_TABLET | Freq: Two times a day (BID) | ORAL | 6 refills | Status: AC

## 2019-09-19 MED ORDER — APIXABAN 2.5 MG PO TABS: 2.5000 mg | ORAL_TABLET | Freq: Two times a day (BID) | ORAL | 6 refills | Status: DC

## 2019-09-19 NOTE — Patient Instructions (Addendum)
Medication Instructions:   Your physician has recommended you make the following change in your medication:   Decrease eliquis to 2.5 mg by mouth twice daily. You may break your 5 mg tablets in half twice daily until they are finished  Continue other medications the same  Labwork:  NONE  Testing/Procedures:  NONE  Follow-Up:  Your physician recommends that you schedule a follow-up appointment in: 6 months (office or virtual). You will receive a reminder letter in the mail in about 4 months reminding you to call and schedule your appointment. If you don't receive this letter, please contact our office.  Any Other Special Instructions Will Be Listed Below (If Applicable).  If you need a refill on your cardiac medications before your next appointment, please call your pharmacy.

## 2019-09-19 NOTE — Progress Notes (Signed)
Cardiology Office Note  Date: 09/19/2019   ID: Teresa, Stevens May 17, 1924, MRN [id number]  PCP:  Teresa Sites, MD  Cardiologist:  Teresa Lesches, MD Electrophysiologist:  None   Chief Complaint  Patient presents with  . Cardiac follow-up    History of Present Illness: Teresa Stevens is a 86 y.o. female last assessed via telehealth encounter in January.  She is here today with family members for a follow-up visit.  I went over her medications and interval lab work which is outlined below.  She does not describe any sense of palpitations or chest pain.  She has had some recurring hemorrhoidal bleeding however and was off Eliquis temporarily.  Her hemoglobin was down to 10.1.  She states that the bleeding completely resolved when she was off Eliquis.  They only just recently started back but have been taking Eliquis once a day.  We discussed compromising with Eliquis 2.5 mg twice daily for now.  Past Medical History:  Diagnosis Date  . Arthritis   . Atrial fibrillation Teresa Stevens)    Documented September 2019  . Essential hypertension   . Pancreatitis     Past Surgical History:  Procedure Laterality Date  . CHOLECYSTECTOMY    . TONSILLECTOMY      Current Outpatient Medications  Medication Sig Dispense Refill  . cyanocobalamin (,VITAMIN B-12,) 1000 MCG/ML injection Inject 1,000 mcg into the muscle every 30 (thirty) days.    . D3-50 1.25 MG (50000 UT) capsule Take 1 capsule by mouth once a week.    . diltiazem (DILTIAZEM CD) 180 MG 24 hr capsule Take 1 capsule (180 mg total) by mouth at bedtime. 30 capsule 2  . furosemide (LASIX) 20 MG tablet Take 20 mg by mouth as needed.    Marland Kitchen guaiFENesin (MUCINEX) 600 MG 12 hr tablet Take 2 tablets (1,200 mg total) by mouth 2 (two) times daily. (Patient taking differently: Take 1,200 mg by mouth 2 (two) times daily as needed. ) 20 tablet 0  . NORCO 5-325 MG tablet Take 1 tablet by mouth every 8 (eight) hours as needed.    Marland Kitchen apixaban (ELIQUIS)  2.5 MG TABS tablet Take 1 tablet (2.5 mg total) by mouth 2 (two) times daily. 60 tablet 6   No current facility-administered medications for this visit.   Allergies:  Penicillins   ROS: Hearing loss.  Physical Exam: VS:  BP (!) 110/54   Pulse 92   Ht 5\' 3"  (1.6 m)   Wt 142 lb 3.2 oz (64.5 kg)   SpO2 100% Comment: on 4 L O2 via [place]  BMI 25.19 kg/m , BMI Body mass index is 25.19 kg/m.  Wt Readings from Last 3 Encounters:  09/19/19 142 lb 3.2 oz (64.5 kg)  03/21/19 144 lb (65.3 kg)  12/08/18 133 lb (60.3 kg)    General: Frail-appearing elderly woman in a wheelchair. HEENT: Conjunctiva and lids normal, wearing a mask. Neck: Supple, no elevated JVP or carotid bruits, no thyromegaly. Lungs: Clear to auscultation, nonlabored breathing at rest. Cardiac: Irregularly irregular, soft systolic murmur. Extremities: Trace ankle edema, distal pulses 2+.  ECG:  An ECG dated 12/22/2017 was personally reviewed today and demonstrated:  Sinus rhythm with PACs and PVCs, nonspecific ST-T changes.  Recent Labwork:  May 2021: BUN 13, creatinine 0.61, potassium 4.7, hemoglobin 10.1, platelets 254  Other Studies Reviewed Today:  Echocardiogram 11/09/2017: Study Conclusions  - Left ventricle: The cavity size was normal. Wall thickness was increased in a pattern of moderate  LVH. Systolic function was normal. The estimated ejection fraction was in the range of 60% to 65%. Wall motion was normal; there were no regional wall motion abnormalities. Features are consistent with a pseudonormal left ventricular filling pattern, with concomitant abnormal relaxation and increased filling pressure (grade 2 diastolic dysfunction). Doppler parameters are consistent with high ventricular filling pressure. - Aortic valve: Mildly calcified annulus. Trileaflet; mildly thickened leaflets. Valve area (VTI): 1.85 cm^2. Valve area (Vmax): 1.7 cm^2. - Mitral valve: Mildly calcified annulus.  Mildly thickened leaflets . There was mild regurgitation. - Left atrium: The atrium was severely dilated. - Pulmonary arteries: Systolic pressure was mildly increased. PA peak pressure: 34 mm Hg (S). - Technically adequate study.  Assessment and Plan:  1.  Permanent atrial fibrillation, no sense of palpitations.  CHA2DS2-VASc score is 4.  Heart rate control is adequate today on Cardizem CD.  Plan is to reduce Eliquis to 2.5 mg twice daily in light of intermittent hemorrhoidal bleeding.  2.  Essential hypertension by history, blood pressure is normal today.  Keep follow-up with PCP.  Medication Adjustments/Labs and Tests Ordered: Current medicines are reviewed at length with the patient today.  Concerns regarding medicines are outlined above.   Tests Ordered: No orders of the defined types were placed in this encounter.   Medication Changes: Meds ordered this encounter  Medications  . apixaban (ELIQUIS) 2.5 MG TABS tablet    Sig: Take 1 tablet (2.5 mg total) by mouth 2 (two) times daily.    Dispense:  60 tablet    Refill:  6    09/19/2019 dose decrease    Disposition:  Follow up virtual or in person in 6 months.  Signed, Satira Sark, MD, [facility] 09/19/2019 12:10 PM    [facility] [place], [place], [place] [address] Phone: [phone]; Fax: [phone]

## 2019-09-20 DIAGNOSIS — I519 Heart disease, unspecified: Secondary | ICD-10-CM | POA: Diagnosis not present

## 2019-09-20 DIAGNOSIS — I5030 Unspecified diastolic (congestive) heart failure: Secondary | ICD-10-CM | POA: Diagnosis not present

## 2019-09-20 DIAGNOSIS — C801 Malignant (primary) neoplasm, unspecified: Secondary | ICD-10-CM | POA: Diagnosis not present

## 2019-09-20 DIAGNOSIS — Z8673 Personal history of transient ischemic attack (TIA), and cerebral infarction without residual deficits: Secondary | ICD-10-CM | POA: Diagnosis not present

## 2019-09-20 DIAGNOSIS — I4891 Unspecified atrial fibrillation: Secondary | ICD-10-CM | POA: Diagnosis not present

## 2019-09-26 DIAGNOSIS — Z8673 Personal history of transient ischemic attack (TIA), and cerebral infarction without residual deficits: Secondary | ICD-10-CM | POA: Diagnosis not present

## 2019-09-26 DIAGNOSIS — I4891 Unspecified atrial fibrillation: Secondary | ICD-10-CM | POA: Diagnosis not present

## 2019-09-26 DIAGNOSIS — I519 Heart disease, unspecified: Secondary | ICD-10-CM | POA: Diagnosis not present

## 2019-09-26 DIAGNOSIS — I5030 Unspecified diastolic (congestive) heart failure: Secondary | ICD-10-CM | POA: Diagnosis not present

## 2019-09-26 DIAGNOSIS — C801 Malignant (primary) neoplasm, unspecified: Secondary | ICD-10-CM | POA: Diagnosis not present

## 2019-09-27 DIAGNOSIS — Z8673 Personal history of transient ischemic attack (TIA), and cerebral infarction without residual deficits: Secondary | ICD-10-CM | POA: Diagnosis not present

## 2019-09-27 DIAGNOSIS — I4891 Unspecified atrial fibrillation: Secondary | ICD-10-CM | POA: Diagnosis not present

## 2019-09-27 DIAGNOSIS — C801 Malignant (primary) neoplasm, unspecified: Secondary | ICD-10-CM | POA: Diagnosis not present

## 2019-09-27 DIAGNOSIS — I5030 Unspecified diastolic (congestive) heart failure: Secondary | ICD-10-CM | POA: Diagnosis not present

## 2019-09-27 DIAGNOSIS — I519 Heart disease, unspecified: Secondary | ICD-10-CM | POA: Diagnosis not present

## 2019-10-01 DIAGNOSIS — C801 Malignant (primary) neoplasm, unspecified: Secondary | ICD-10-CM | POA: Diagnosis not present

## 2019-10-01 DIAGNOSIS — I519 Heart disease, unspecified: Secondary | ICD-10-CM | POA: Diagnosis not present

## 2019-10-01 DIAGNOSIS — I4891 Unspecified atrial fibrillation: Secondary | ICD-10-CM | POA: Diagnosis not present

## 2019-10-01 DIAGNOSIS — Z8673 Personal history of transient ischemic attack (TIA), and cerebral infarction without residual deficits: Secondary | ICD-10-CM | POA: Diagnosis not present

## 2019-10-01 DIAGNOSIS — I5030 Unspecified diastolic (congestive) heart failure: Secondary | ICD-10-CM | POA: Diagnosis not present

## 2019-10-03 ENCOUNTER — Telehealth: Payer: Self-pay | Admitting: Cardiology

## 2019-10-03 DIAGNOSIS — I4891 Unspecified atrial fibrillation: Secondary | ICD-10-CM | POA: Diagnosis not present

## 2019-10-03 DIAGNOSIS — I5030 Unspecified diastolic (congestive) heart failure: Secondary | ICD-10-CM | POA: Diagnosis not present

## 2019-10-03 DIAGNOSIS — C801 Malignant (primary) neoplasm, unspecified: Secondary | ICD-10-CM | POA: Diagnosis not present

## 2019-10-03 DIAGNOSIS — I519 Heart disease, unspecified: Secondary | ICD-10-CM | POA: Diagnosis not present

## 2019-10-03 DIAGNOSIS — Z8673 Personal history of transient ischemic attack (TIA), and cerebral infarction without residual deficits: Secondary | ICD-10-CM | POA: Diagnosis not present

## 2019-10-03 NOTE — Telephone Encounter (Signed)
Jeneen Rinks (son) notified - stated that Dr. Collene Mares was the doc that started her Metoprolol.

## 2019-10-03 NOTE — Telephone Encounter (Signed)
Son Teresa Stevens) concerned about elevated heart rate.  Hospice nurse coming out daily.  Last telehealth visit was on 09/19/19 with Dr. Domenic Polite.  HR at that OV was 92.    HR running 100-120's &  111-128.  Today was 148.  Been elevated over the last week.  Hospice nurse came out to check on her & was given order for Metoprolol twice a day.  Stating that this medication should be delivered directly.    Son stated that she had a light stroke on Sunday - had some confusion & weakness in hands.  Hospice nurse came out again to assess and after a few hours seemed to be doing much better.    Now running about 82.    Seems to be doing okay right now, alert & can get self to bathroom as well.  Just wanted to make Dr. Domenic Polite aware.

## 2019-10-03 NOTE — Telephone Encounter (Signed)
Patient is under the care of Hospice. Son called stating that her HR continues to go up.States that [place], RN with Hospice told son that patient had a stroke on 10/01/2019.

## 2019-10-03 NOTE — Telephone Encounter (Signed)
Thank you for keeping me updated.  I am sorry that she is having these difficulties, but I am glad that she has close follow-up with the Hospice team.  It sounds like they are doing a nice job.

## 2019-10-04 DIAGNOSIS — I519 Heart disease, unspecified: Secondary | ICD-10-CM | POA: Diagnosis not present

## 2019-10-04 DIAGNOSIS — C801 Malignant (primary) neoplasm, unspecified: Secondary | ICD-10-CM | POA: Diagnosis not present

## 2019-10-04 DIAGNOSIS — I4891 Unspecified atrial fibrillation: Secondary | ICD-10-CM | POA: Diagnosis not present

## 2019-10-04 DIAGNOSIS — Z8673 Personal history of transient ischemic attack (TIA), and cerebral infarction without residual deficits: Secondary | ICD-10-CM | POA: Diagnosis not present

## 2019-10-04 DIAGNOSIS — I5030 Unspecified diastolic (congestive) heart failure: Secondary | ICD-10-CM | POA: Diagnosis not present

## 2019-10-09 DIAGNOSIS — I4891 Unspecified atrial fibrillation: Secondary | ICD-10-CM | POA: Diagnosis not present

## 2019-10-09 DIAGNOSIS — I519 Heart disease, unspecified: Secondary | ICD-10-CM | POA: Diagnosis not present

## 2019-10-09 DIAGNOSIS — C801 Malignant (primary) neoplasm, unspecified: Secondary | ICD-10-CM | POA: Diagnosis not present

## 2019-10-09 DIAGNOSIS — Z8673 Personal history of transient ischemic attack (TIA), and cerebral infarction without residual deficits: Secondary | ICD-10-CM | POA: Diagnosis not present

## 2019-10-09 DIAGNOSIS — I5030 Unspecified diastolic (congestive) heart failure: Secondary | ICD-10-CM | POA: Diagnosis not present

## 2019-10-10 DIAGNOSIS — I4891 Unspecified atrial fibrillation: Secondary | ICD-10-CM | POA: Diagnosis not present

## 2019-10-10 DIAGNOSIS — Z8673 Personal history of transient ischemic attack (TIA), and cerebral infarction without residual deficits: Secondary | ICD-10-CM | POA: Diagnosis not present

## 2019-10-10 DIAGNOSIS — I5030 Unspecified diastolic (congestive) heart failure: Secondary | ICD-10-CM | POA: Diagnosis not present

## 2019-10-10 DIAGNOSIS — I519 Heart disease, unspecified: Secondary | ICD-10-CM | POA: Diagnosis not present

## 2019-10-10 DIAGNOSIS — C801 Malignant (primary) neoplasm, unspecified: Secondary | ICD-10-CM | POA: Diagnosis not present

## 2019-10-17 DIAGNOSIS — Z8673 Personal history of transient ischemic attack (TIA), and cerebral infarction without residual deficits: Secondary | ICD-10-CM | POA: Diagnosis not present

## 2019-10-17 DIAGNOSIS — I519 Heart disease, unspecified: Secondary | ICD-10-CM | POA: Diagnosis not present

## 2019-10-17 DIAGNOSIS — I5030 Unspecified diastolic (congestive) heart failure: Secondary | ICD-10-CM | POA: Diagnosis not present

## 2019-10-17 DIAGNOSIS — I4891 Unspecified atrial fibrillation: Secondary | ICD-10-CM | POA: Diagnosis not present

## 2019-10-17 DIAGNOSIS — C801 Malignant (primary) neoplasm, unspecified: Secondary | ICD-10-CM | POA: Diagnosis not present

## 2019-10-20 DIAGNOSIS — I519 Heart disease, unspecified: Secondary | ICD-10-CM | POA: Diagnosis not present

## 2019-10-20 DIAGNOSIS — I5030 Unspecified diastolic (congestive) heart failure: Secondary | ICD-10-CM | POA: Diagnosis not present

## 2019-10-20 DIAGNOSIS — C801 Malignant (primary) neoplasm, unspecified: Secondary | ICD-10-CM | POA: Diagnosis not present

## 2019-10-20 DIAGNOSIS — I4891 Unspecified atrial fibrillation: Secondary | ICD-10-CM | POA: Diagnosis not present

## 2019-10-20 DIAGNOSIS — Z8673 Personal history of transient ischemic attack (TIA), and cerebral infarction without residual deficits: Secondary | ICD-10-CM | POA: Diagnosis not present

## 2019-10-23 DIAGNOSIS — I4891 Unspecified atrial fibrillation: Secondary | ICD-10-CM | POA: Diagnosis not present

## 2019-10-23 DIAGNOSIS — C801 Malignant (primary) neoplasm, unspecified: Secondary | ICD-10-CM | POA: Diagnosis not present

## 2019-10-23 DIAGNOSIS — Z8673 Personal history of transient ischemic attack (TIA), and cerebral infarction without residual deficits: Secondary | ICD-10-CM | POA: Diagnosis not present

## 2019-10-23 DIAGNOSIS — I519 Heart disease, unspecified: Secondary | ICD-10-CM | POA: Diagnosis not present

## 2019-10-23 DIAGNOSIS — I5030 Unspecified diastolic (congestive) heart failure: Secondary | ICD-10-CM | POA: Diagnosis not present

## 2019-10-27 DIAGNOSIS — Z8673 Personal history of transient ischemic attack (TIA), and cerebral infarction without residual deficits: Secondary | ICD-10-CM | POA: Diagnosis not present

## 2019-10-27 DIAGNOSIS — C801 Malignant (primary) neoplasm, unspecified: Secondary | ICD-10-CM | POA: Diagnosis not present

## 2019-10-27 DIAGNOSIS — I519 Heart disease, unspecified: Secondary | ICD-10-CM | POA: Diagnosis not present

## 2019-10-27 DIAGNOSIS — I5030 Unspecified diastolic (congestive) heart failure: Secondary | ICD-10-CM | POA: Diagnosis not present

## 2019-10-27 DIAGNOSIS — I4891 Unspecified atrial fibrillation: Secondary | ICD-10-CM | POA: Diagnosis not present

## 2019-10-30 DIAGNOSIS — Z8673 Personal history of transient ischemic attack (TIA), and cerebral infarction without residual deficits: Secondary | ICD-10-CM | POA: Diagnosis not present

## 2019-10-30 DIAGNOSIS — C801 Malignant (primary) neoplasm, unspecified: Secondary | ICD-10-CM | POA: Diagnosis not present

## 2019-10-30 DIAGNOSIS — I4891 Unspecified atrial fibrillation: Secondary | ICD-10-CM | POA: Diagnosis not present

## 2019-10-30 DIAGNOSIS — I519 Heart disease, unspecified: Secondary | ICD-10-CM | POA: Diagnosis not present

## 2019-10-30 DIAGNOSIS — I5030 Unspecified diastolic (congestive) heart failure: Secondary | ICD-10-CM | POA: Diagnosis not present

## 2019-11-01 DIAGNOSIS — I519 Heart disease, unspecified: Secondary | ICD-10-CM | POA: Diagnosis not present

## 2019-11-01 DIAGNOSIS — Z8673 Personal history of transient ischemic attack (TIA), and cerebral infarction without residual deficits: Secondary | ICD-10-CM | POA: Diagnosis not present

## 2019-11-01 DIAGNOSIS — C801 Malignant (primary) neoplasm, unspecified: Secondary | ICD-10-CM | POA: Diagnosis not present

## 2019-11-01 DIAGNOSIS — I5030 Unspecified diastolic (congestive) heart failure: Secondary | ICD-10-CM | POA: Diagnosis not present

## 2019-11-01 DIAGNOSIS — I4891 Unspecified atrial fibrillation: Secondary | ICD-10-CM | POA: Diagnosis not present

## 2019-11-02 DIAGNOSIS — I5030 Unspecified diastolic (congestive) heart failure: Secondary | ICD-10-CM | POA: Diagnosis not present

## 2019-11-02 DIAGNOSIS — I519 Heart disease, unspecified: Secondary | ICD-10-CM | POA: Diagnosis not present

## 2019-11-02 DIAGNOSIS — C801 Malignant (primary) neoplasm, unspecified: Secondary | ICD-10-CM | POA: Diagnosis not present

## 2019-11-02 DIAGNOSIS — I4891 Unspecified atrial fibrillation: Secondary | ICD-10-CM | POA: Diagnosis not present

## 2019-11-02 DIAGNOSIS — Z8673 Personal history of transient ischemic attack (TIA), and cerebral infarction without residual deficits: Secondary | ICD-10-CM | POA: Diagnosis not present

## 2019-11-07 DIAGNOSIS — I5030 Unspecified diastolic (congestive) heart failure: Secondary | ICD-10-CM | POA: Diagnosis not present

## 2019-11-07 DIAGNOSIS — C801 Malignant (primary) neoplasm, unspecified: Secondary | ICD-10-CM | POA: Diagnosis not present

## 2019-11-07 DIAGNOSIS — I519 Heart disease, unspecified: Secondary | ICD-10-CM | POA: Diagnosis not present

## 2019-11-07 DIAGNOSIS — I4891 Unspecified atrial fibrillation: Secondary | ICD-10-CM | POA: Diagnosis not present

## 2019-11-07 DIAGNOSIS — Z8673 Personal history of transient ischemic attack (TIA), and cerebral infarction without residual deficits: Secondary | ICD-10-CM | POA: Diagnosis not present

## 2019-11-08 DIAGNOSIS — I4891 Unspecified atrial fibrillation: Secondary | ICD-10-CM | POA: Diagnosis not present

## 2019-11-08 DIAGNOSIS — I5030 Unspecified diastolic (congestive) heart failure: Secondary | ICD-10-CM | POA: Diagnosis not present

## 2019-11-08 DIAGNOSIS — Z8673 Personal history of transient ischemic attack (TIA), and cerebral infarction without residual deficits: Secondary | ICD-10-CM | POA: Diagnosis not present

## 2019-11-08 DIAGNOSIS — I519 Heart disease, unspecified: Secondary | ICD-10-CM | POA: Diagnosis not present

## 2019-11-08 DIAGNOSIS — C801 Malignant (primary) neoplasm, unspecified: Secondary | ICD-10-CM | POA: Diagnosis not present

## 2019-11-14 DIAGNOSIS — I4891 Unspecified atrial fibrillation: Secondary | ICD-10-CM | POA: Diagnosis not present

## 2019-11-14 DIAGNOSIS — I5030 Unspecified diastolic (congestive) heart failure: Secondary | ICD-10-CM | POA: Diagnosis not present

## 2019-11-14 DIAGNOSIS — Z8673 Personal history of transient ischemic attack (TIA), and cerebral infarction without residual deficits: Secondary | ICD-10-CM | POA: Diagnosis not present

## 2019-11-14 DIAGNOSIS — C801 Malignant (primary) neoplasm, unspecified: Secondary | ICD-10-CM | POA: Diagnosis not present

## 2019-11-14 DIAGNOSIS — I519 Heart disease, unspecified: Secondary | ICD-10-CM | POA: Diagnosis not present

## 2019-11-20 DIAGNOSIS — Z8673 Personal history of transient ischemic attack (TIA), and cerebral infarction without residual deficits: Secondary | ICD-10-CM | POA: Diagnosis not present

## 2019-11-20 DIAGNOSIS — I5030 Unspecified diastolic (congestive) heart failure: Secondary | ICD-10-CM | POA: Diagnosis not present

## 2019-11-20 DIAGNOSIS — I519 Heart disease, unspecified: Secondary | ICD-10-CM | POA: Diagnosis not present

## 2019-11-20 DIAGNOSIS — C801 Malignant (primary) neoplasm, unspecified: Secondary | ICD-10-CM | POA: Diagnosis not present

## 2019-11-20 DIAGNOSIS — I4891 Unspecified atrial fibrillation: Secondary | ICD-10-CM | POA: Diagnosis not present

## 2019-11-23 DIAGNOSIS — I5030 Unspecified diastolic (congestive) heart failure: Secondary | ICD-10-CM | POA: Diagnosis not present

## 2019-11-23 DIAGNOSIS — C801 Malignant (primary) neoplasm, unspecified: Secondary | ICD-10-CM | POA: Diagnosis not present

## 2019-11-23 DIAGNOSIS — Z8673 Personal history of transient ischemic attack (TIA), and cerebral infarction without residual deficits: Secondary | ICD-10-CM | POA: Diagnosis not present

## 2019-11-23 DIAGNOSIS — I4891 Unspecified atrial fibrillation: Secondary | ICD-10-CM | POA: Diagnosis not present

## 2019-11-23 DIAGNOSIS — I519 Heart disease, unspecified: Secondary | ICD-10-CM | POA: Diagnosis not present

## 2019-11-27 DIAGNOSIS — C801 Malignant (primary) neoplasm, unspecified: Secondary | ICD-10-CM | POA: Diagnosis not present

## 2019-11-27 DIAGNOSIS — I519 Heart disease, unspecified: Secondary | ICD-10-CM | POA: Diagnosis not present

## 2019-11-27 DIAGNOSIS — Z8673 Personal history of transient ischemic attack (TIA), and cerebral infarction without residual deficits: Secondary | ICD-10-CM | POA: Diagnosis not present

## 2019-11-27 DIAGNOSIS — I5030 Unspecified diastolic (congestive) heart failure: Secondary | ICD-10-CM | POA: Diagnosis not present

## 2019-11-27 DIAGNOSIS — I4891 Unspecified atrial fibrillation: Secondary | ICD-10-CM | POA: Diagnosis not present

## 2019-11-30 DIAGNOSIS — I5030 Unspecified diastolic (congestive) heart failure: Secondary | ICD-10-CM | POA: Diagnosis not present

## 2019-11-30 DIAGNOSIS — Z8673 Personal history of transient ischemic attack (TIA), and cerebral infarction without residual deficits: Secondary | ICD-10-CM | POA: Diagnosis not present

## 2019-11-30 DIAGNOSIS — C801 Malignant (primary) neoplasm, unspecified: Secondary | ICD-10-CM | POA: Diagnosis not present

## 2019-11-30 DIAGNOSIS — I519 Heart disease, unspecified: Secondary | ICD-10-CM | POA: Diagnosis not present

## 2019-11-30 DIAGNOSIS — I4891 Unspecified atrial fibrillation: Secondary | ICD-10-CM | POA: Diagnosis not present

## 2019-12-01 DIAGNOSIS — Z8673 Personal history of transient ischemic attack (TIA), and cerebral infarction without residual deficits: Secondary | ICD-10-CM | POA: Diagnosis not present

## 2019-12-01 DIAGNOSIS — I519 Heart disease, unspecified: Secondary | ICD-10-CM | POA: Diagnosis not present

## 2019-12-01 DIAGNOSIS — C801 Malignant (primary) neoplasm, unspecified: Secondary | ICD-10-CM | POA: Diagnosis not present

## 2019-12-01 DIAGNOSIS — I5030 Unspecified diastolic (congestive) heart failure: Secondary | ICD-10-CM | POA: Diagnosis not present

## 2019-12-01 DIAGNOSIS — I4891 Unspecified atrial fibrillation: Secondary | ICD-10-CM | POA: Diagnosis not present

## 2019-12-06 DIAGNOSIS — C801 Malignant (primary) neoplasm, unspecified: Secondary | ICD-10-CM | POA: Diagnosis not present

## 2019-12-06 DIAGNOSIS — I5030 Unspecified diastolic (congestive) heart failure: Secondary | ICD-10-CM | POA: Diagnosis not present

## 2019-12-06 DIAGNOSIS — I4891 Unspecified atrial fibrillation: Secondary | ICD-10-CM | POA: Diagnosis not present

## 2019-12-06 DIAGNOSIS — Z8673 Personal history of transient ischemic attack (TIA), and cerebral infarction without residual deficits: Secondary | ICD-10-CM | POA: Diagnosis not present

## 2019-12-06 DIAGNOSIS — I519 Heart disease, unspecified: Secondary | ICD-10-CM | POA: Diagnosis not present

## 2019-12-10 DIAGNOSIS — Z8673 Personal history of transient ischemic attack (TIA), and cerebral infarction without residual deficits: Secondary | ICD-10-CM | POA: Diagnosis not present

## 2019-12-10 DIAGNOSIS — I5030 Unspecified diastolic (congestive) heart failure: Secondary | ICD-10-CM | POA: Diagnosis not present

## 2019-12-10 DIAGNOSIS — C801 Malignant (primary) neoplasm, unspecified: Secondary | ICD-10-CM | POA: Diagnosis not present

## 2019-12-10 DIAGNOSIS — I519 Heart disease, unspecified: Secondary | ICD-10-CM | POA: Diagnosis not present

## 2019-12-10 DIAGNOSIS — I4891 Unspecified atrial fibrillation: Secondary | ICD-10-CM | POA: Diagnosis not present

## 2020-01-01 DEATH — deceased
